# Patient Record
Sex: Female | Born: 1980 | Race: White | Hispanic: No | Marital: Married | Smoking: Never smoker
Health system: Southern US, Community
[De-identification: ages and names within clinical notes are randomized; demographics above are authoritative.]

## PROBLEM LIST (undated history)

## (undated) ENCOUNTER — Inpatient Hospital Stay (HOSPITAL_COMMUNITY): Payer: Self-pay

## (undated) DIAGNOSIS — Z8619 Personal history of other infectious and parasitic diseases: Secondary | ICD-10-CM

## (undated) DIAGNOSIS — Z789 Other specified health status: Secondary | ICD-10-CM

## (undated) HISTORY — PX: TONSILLECTOMY: SUR1361

## (undated) HISTORY — DX: Personal history of other infectious and parasitic diseases: Z86.19

## (undated) HISTORY — PX: NO PAST SURGERIES: SHX2092

## (undated) HISTORY — PX: COLONOSCOPY: SHX174

---

## 2011-05-26 ENCOUNTER — Encounter (HOSPITAL_COMMUNITY): Payer: Self-pay | Admitting: *Deleted

## 2011-05-26 ENCOUNTER — Inpatient Hospital Stay (HOSPITAL_COMMUNITY)
Admission: AD | Admit: 2011-05-26 | Discharge: 2011-05-30 | DRG: 373 | Disposition: A | Discharge status: Home / Self Care | Payer: BC Managed Care – PPO | Source: Ambulatory Visit | Attending: Obstetrics and Gynecology | Admitting: Obstetrics and Gynecology

## 2011-05-26 DIAGNOSIS — O480 Post-term pregnancy: Principal | ICD-10-CM | POA: Diagnosis present

## 2011-05-26 LAB — CBC
HCT: 36 % (ref 36.0–46.0)
RBC: 3.95 MIL/uL (ref 3.87–5.11)
RDW: 13.4 % (ref 11.5–15.5)
WBC: 9.3 10*3/uL (ref 4.0–10.5)

## 2011-05-28 LAB — ABO/RH: ABO/RH(D): O POS

## 2011-05-29 LAB — CBC
HCT: 25.9 % — ABNORMAL LOW (ref 36.0–46.0)
Platelets: 116 10*3/uL — ABNORMAL LOW (ref 150–400)
RBC: 2.84 MIL/uL — ABNORMAL LOW (ref 3.87–5.11)
RDW: 14.1 % (ref 11.5–15.5)
WBC: 18.3 10*3/uL — ABNORMAL HIGH (ref 4.0–10.5)

## 2011-06-12 NOTE — H&P (Signed)
  NAMENACOLE, FLUHR                 ACCOUNT NO.:  [id number]  MEDICAL RECORD NO.:  [id number]  LOCATION:  9174                          FACILITY:  [facility]  PHYSICIAN:  Anum Palecek H. Tenny Craw, MD     DATE OF BIRTH:  May 16, 1981  DATE OF ADMISSION:  05/27/2011 DATE OF DISCHARGE:                             HISTORY & PHYSICAL   CHIEF COMPLAINT:  Induction of labor.  HISTORY OF PRESENT ILLNESS:  Ms. Simko is a 30 year old G1, P0 at 40 weeks and 1 day estimated gestational age who presents for postdates induction of labor.  Her pregnancy has been uncomplicated up to this point.  She presents today for a scheduled postdates induction of labor.  PAST MEDICAL HISTORY:  Irritable bowel syndrome.  PAST SURGICAL HISTORY:  None.  PAST OBSTETRICAL HISTORY:  G1, P0.  PAST GYNECOLOGICAL HISTORY:  None.  SOCIAL HISTORY:  Negative x3.  FAMILY HISTORY:  Noncontributory.  PRENATAL LABORATORY DATA:  Blood type O+, antibody screen negative, RPR nonreactive, rubella titer immune, hepatitis B surface antigen negative, HIV nonreactive, and 1-hour sugar test 97.  First trimester screen, no increased risk.  GC and Chlamydia negative.  GBS negative.  Anatomy ultrasound was within normal limits.  Ultrasound for estimated fetal weight on Apr 13, 2011 was 2330 grams or 58th percentile.  PHYSICAL EXAMINATION:  VITAL SIGNS:  Afebrile, vital signs stable. GENERAL:  Alert and oriented x3.  No apparent distress. ABDOMEN:  Soft, nontender, and nondistended. EXTERNAL GENITALIA:  Cervix is 117, -2.  Fetal heart rate is in 130s and reactive.  Contractions are irregular.  ASSESSMENT AND PLAN:  This is a 30 year old G1, P0 at 40 weeks and 1 day for induction of labor.  The patient may have an epidural on request, Pitocin for augmentation, and amniotomy when able.     Freddrick March. Tenny Craw, MD     [facility]  D:  05/27/2011  T:  05/27/2011  Job:  [id number]  Electronically Signed by Waynard Reeds MD on 06/12/2011 11:01:52 AM

## 2014-07-23 LAB — OB RESULTS CONSOLE HEPATITIS B SURFACE ANTIGEN: HEP B S AG: NEGATIVE

## 2014-07-23 LAB — OB RESULTS CONSOLE ABO/RH: RH Type: POSITIVE

## 2014-07-23 LAB — OB RESULTS CONSOLE ANTIBODY SCREEN: Antibody Screen: NEGATIVE

## 2014-07-23 LAB — OB RESULTS CONSOLE GC/CHLAMYDIA
Chlamydia: NEGATIVE
GC PROBE AMP, GENITAL: NEGATIVE

## 2014-07-23 LAB — OB RESULTS CONSOLE RPR: RPR: NONREACTIVE

## 2014-07-23 LAB — OB RESULTS CONSOLE HIV ANTIBODY (ROUTINE TESTING): HIV: NONREACTIVE

## 2014-07-23 LAB — OB RESULTS CONSOLE RUBELLA ANTIBODY, IGM: RUBELLA: IMMUNE

## 2014-10-08 ENCOUNTER — Encounter (HOSPITAL_COMMUNITY): Payer: Self-pay | Admitting: *Deleted

## 2014-11-20 ENCOUNTER — Encounter (HOSPITAL_COMMUNITY): Payer: Self-pay | Admitting: *Deleted

## 2014-11-20 ENCOUNTER — Inpatient Hospital Stay (HOSPITAL_COMMUNITY)
Admission: AD | Admit: 2014-11-20 | Discharge: 2014-11-20 | Disposition: A | Discharge status: Home / Self Care | Payer: BC Managed Care – PPO | Source: Ambulatory Visit | Attending: Emergency Medicine | Admitting: Emergency Medicine

## 2014-11-20 DIAGNOSIS — Z3A28 28 weeks gestation of pregnancy: Secondary | ICD-10-CM | POA: Insufficient documentation

## 2014-11-20 DIAGNOSIS — R531 Weakness: Secondary | ICD-10-CM | POA: Diagnosis not present

## 2014-11-20 DIAGNOSIS — R42 Dizziness and giddiness: Secondary | ICD-10-CM | POA: Diagnosis present

## 2014-11-20 DIAGNOSIS — R5381 Other malaise: Secondary | ICD-10-CM | POA: Insufficient documentation

## 2014-11-20 DIAGNOSIS — K219 Gastro-esophageal reflux disease without esophagitis: Secondary | ICD-10-CM

## 2014-11-20 DIAGNOSIS — O9989 Other specified diseases and conditions complicating pregnancy, childbirth and the puerperium: Secondary | ICD-10-CM | POA: Diagnosis not present

## 2014-11-20 HISTORY — DX: Other specified health status: Z78.9

## 2014-11-20 LAB — CBC
HEMATOCRIT: 33.8 % — AB (ref 36.0–46.0)
HEMOGLOBIN: 11.2 g/dL — AB (ref 12.0–15.0)
MCH: 31.5 pg (ref 26.0–34.0)
MCHC: 33.1 g/dL (ref 30.0–36.0)
MCV: 94.9 fL (ref 78.0–100.0)
Platelets: 160 10*3/uL (ref 150–400)
RBC: 3.56 MIL/uL — ABNORMAL LOW (ref 3.87–5.11)
RDW: 13.2 % (ref 11.5–15.5)
WBC: 7.6 10*3/uL (ref 4.0–10.5)

## 2014-11-20 LAB — COMPREHENSIVE METABOLIC PANEL
ALBUMIN: 3 g/dL — AB (ref 3.5–5.2)
ALK PHOS: 92 U/L (ref 39–117)
ALT: 7 U/L (ref 0–35)
ANION GAP: 13 (ref 5–15)
AST: 20 U/L (ref 0–37)
BUN: 5 mg/dL — AB (ref 6–23)
CALCIUM: 8.3 mg/dL — AB (ref 8.4–10.5)
CO2: 22 mEq/L (ref 19–32)
CREATININE: 0.52 mg/dL (ref 0.50–1.10)
Chloride: 102 mEq/L (ref 96–112)
GFR calc non Af Amer: >90 mL/min (ref >90)
GLUCOSE: 101 mg/dL — AB (ref 70–99)
Potassium: 3.8 mEq/L (ref 3.7–5.3)
Sodium: 137 mEq/L (ref 137–147)
TOTAL PROTEIN: 6.3 g/dL (ref 6.0–8.3)
Total Bilirubin: 0.3 mg/dL (ref 0.3–1.2)

## 2014-11-20 LAB — URINALYSIS, ROUTINE W REFLEX MICROSCOPIC
BILIRUBIN URINE: NEGATIVE
GLUCOSE, UA: NEGATIVE mg/dL
Hgb urine dipstick: NEGATIVE
KETONES UR: NEGATIVE mg/dL
LEUKOCYTES UA: NEGATIVE
Nitrite: NEGATIVE
PH: 7.5 (ref 5.0–8.0)
PROTEIN: NEGATIVE mg/dL
Specific Gravity, Urine: <1.005 — ABNORMAL LOW (ref 1.005–1.030)
Urobilinogen, UA: 0.2 mg/dL (ref 0.0–1.0)

## 2014-11-20 LAB — TSH: TSH: 3.19 u[IU]/mL (ref 0.350–4.500)

## 2014-11-20 LAB — I-STAT TROPONIN, ED: Troponin i, poc: 0 ng/mL (ref 0.00–0.08)

## 2014-11-20 MED ORDER — SODIUM CHLORIDE 0.9 % IV BOLUS (SEPSIS)
1000.0000 mL | Freq: Once | INTRAVENOUS | Status: AC
  Administered 2014-11-20: 1000 mL via INTRAVENOUS

## 2014-11-20 MED ORDER — PANTOPRAZOLE SODIUM 20 MG PO TBEC: 20.0000 mg | DELAYED_RELEASE_TABLET | Freq: Every day | ORAL | Status: DC

## 2014-11-20 MED ORDER — GI COCKTAIL ~~LOC~~
30.0000 mL | Freq: Once | ORAL | Status: AC
  Administered 2014-11-20: 30 mL via ORAL
  Filled 2014-11-20: qty 30

## 2014-11-20 NOTE — MAU Note (Signed)
Feels very heavy like something is pulling her to the ground.  Feels very dizzy and nauseous.  On and off for the last month but has felt this way for three days straight.  Was instructed to come in if it happened again.  Denies LOF/VB.

## 2014-11-20 NOTE — ED Notes (Signed)
Pt is stable upon d/c and will return home with s/o. Pt. Will be escorted out by this RN.

## 2014-11-20 NOTE — Discharge Instructions (Signed)
Dizziness °Dizziness is a common problem. It is a feeling of unsteadiness or light-headedness. You may feel like you are about to faint. Dizziness can lead to injury if you stumble or fall. A person of any age group can suffer from dizziness, but dizziness is more common in older adults. °CAUSES  °Dizziness can be caused by many different things, including: °· Middle ear problems. °· Standing for too long. °· Infections. °· An allergic reaction. °· Aging. °· An emotional response to something, such as the sight of blood. °· Side effects of medicines. °· Tiredness. °· Problems with circulation or blood pressure. °· Excessive use of alcohol or medicines, or illegal drug use. °· Breathing too fast (hyperventilation). °· An irregular heart rhythm (arrhythmia). °· A low red blood cell count (anemia). °· Pregnancy. °· Vomiting, diarrhea, fever, or other illnesses that cause body fluid loss (dehydration). °· Diseases or conditions such as Parkinson's disease, high blood pressure (hypertension), diabetes, and thyroid problems. °· Exposure to extreme heat. °DIAGNOSIS  °Your health care provider will ask about your symptoms, perform a physical exam, and perform an electrocardiogram (ECG) to record the electrical activity of your heart. Your health care provider may also perform other heart or blood tests to determine the cause of your dizziness. These may include: °· Transthoracic echocardiogram (TTE). During echocardiography, sound waves are used to evaluate how blood flows through your heart. °· Transesophageal echocardiogram (TEE). °· Cardiac monitoring. This allows your health care provider to monitor your heart rate and rhythm in real time. °· Holter monitor. This is a portable device that records your heartbeat and can help diagnose heart arrhythmias. It allows your health care provider to track your heart activity for several days if needed. °· Stress tests by exercise or by giving medicine that makes the heart beat  faster. °TREATMENT  °Treatment of dizziness depends on the cause of your symptoms and can vary greatly. °HOME CARE INSTRUCTIONS  °· Drink enough fluids to keep your urine clear or pale yellow. This is especially important in very hot weather. In older adults, it is also important in cold weather. °· Take your medicine exactly as directed if your dizziness is caused by medicines. When taking blood pressure medicines, it is especially important to get up slowly. °· Rise slowly from chairs and steady yourself until you feel okay. °· In the morning, first sit up on the side of the bed. When you feel okay, stand slowly while holding onto something until you know your balance is fine. °· Move your legs often if you need to stand in one place for a long time. Tighten and relax your muscles in your legs while standing. °· Have someone stay with you for 1-2 days if dizziness continues to be a problem. Do this until you feel you are well enough to stay alone. Have the person call your health care provider if he or she notices changes in you that are concerning. °· Do not drive or use heavy machinery if you feel dizzy. °· Do not drink alcohol. °SEEK IMMEDIATE MEDICAL CARE IF:  °· Your dizziness or light-headedness gets worse. °· You feel nauseous or vomit. °· You have problems talking, walking, or using your arms, hands, or legs. °· You feel weak. °· You are not thinking clearly or you have trouble forming sentences. It may take a friend or family member to notice this. °· You have chest pain, abdominal pain, shortness of breath, or sweating. °· Your vision changes. °· You notice   any bleeding. °· You have side effects from medicine that seems to be getting worse rather than better. °MAKE SURE YOU:  °· Understand these instructions. °· Will watch your condition. °· Will get help right away if you are not doing well or get worse. °Document Released: 05/19/2001 Document Revised: 11/28/2013 Document Reviewed: 06/12/2011 °ExitCare®  Patient Information ©2015 [employer]. This information is not intended to replace advice given to you by your health care provider. Make sure you discuss any questions you have with your health care provider. ° °Second Trimester of Pregnancy °The second trimester is from week 13 through week 28, months 4 through 6. The second trimester is often a time when you feel your best. Your body has also adjusted to being pregnant, and you begin to feel better physically. Usually, morning sickness has lessened or quit completely, you may have more energy, and you may have an increase in appetite. The second trimester is also a time when the fetus is growing rapidly. At the end of the sixth month, the fetus is about 9 inches long and weighs about 1½ pounds. You will likely begin to feel the baby move (quickening) between 18 and 20 weeks of the pregnancy. °BODY CHANGES °Your body goes through many changes during pregnancy. The changes vary from woman to woman.  °· Your weight will continue to increase. You will notice your lower abdomen bulging out. °· You may begin to get stretch marks on your hips, abdomen, and breasts. °· You may develop headaches that can be relieved by medicines approved by your health care provider. °· You may urinate more often because the fetus is pressing on your bladder. °· You may develop or continue to have heartburn as a result of your pregnancy. °· You may develop constipation because certain hormones are causing the muscles that push waste through your intestines to slow down. °· You may develop hemorrhoids or swollen, bulging veins (varicose veins). °· You may have back pain because of the weight gain and pregnancy hormones relaxing your joints between the bones in your pelvis and as a result of a shift in weight and the muscles that support your balance. °· Your breasts will continue to grow and be tender. °· Your gums may bleed and may be sensitive to brushing and flossing. °· Dark spots or  blotches (chloasma, mask of pregnancy) may develop on your face. This will likely fade after the baby is born. °· A dark line from your belly button to the pubic area (linea nigra) may appear. This will likely fade after the baby is born. °· You may have changes in your hair. These can include thickening of your hair, rapid growth, and changes in texture. Some women also have hair loss during or after pregnancy, or hair that feels dry or thin. Your hair will most likely return to normal after your baby is born. °WHAT TO EXPECT AT YOUR PRENATAL VISITS °During a routine prenatal visit: °· You will be weighed to make sure you and the fetus are growing normally. °· Your blood pressure will be taken. °· Your abdomen will be measured to track your baby's growth. °· The fetal heartbeat will be listened to. °· Any test results from the previous visit will be discussed. °Your health care provider may ask you: °· How you are feeling. °· If you are feeling the baby move. °· If you have had any abnormal symptoms, such as leaking fluid, bleeding, severe headaches, or abdominal cramping. °· If you   have any questions. °Other tests that may be performed during your second trimester include: °· Blood tests that check for: °¨ Low iron levels (anemia). °¨ Gestational diabetes (between 24 and 28 weeks). °¨ Rh antibodies. °· Urine tests to check for infections, diabetes, or protein in the urine. °· An ultrasound to confirm the proper growth and development of the baby. °· An amniocentesis to check for possible genetic problems. °· Fetal screens for spina bifida and Down syndrome. °HOME CARE INSTRUCTIONS  °· Avoid all smoking, herbs, alcohol, and unprescribed drugs. These chemicals affect the formation and growth of the baby. °· Follow your health care provider's instructions regarding medicine use. There are medicines that are either safe or unsafe to take during pregnancy. °· Exercise only as directed by your health care provider.  Experiencing uterine cramps is a [place] sign to stop exercising. °· Continue to eat regular, healthy meals. °· Wear a Schloemer support bra for breast tenderness. °· Do not use hot tubs, steam rooms, or saunas. °· Wear your seat belt at all times when driving. °· Avoid raw meat, uncooked cheese, cat litter boxes, and soil used by cats. These carry germs that can cause birth defects in the baby. °· Take your prenatal vitamins. °· Try taking a stool softener (if your health care provider approves) if you develop constipation. Eat more high-fiber foods, such as fresh vegetables or fruit and whole grains. Drink plenty of fluids to keep your urine clear or pale yellow. °· Take warm sitz baths to soothe any pain or discomfort caused by hemorrhoids. Use hemorrhoid cream if your health care provider approves. °· If you develop varicose veins, wear support hose. Elevate your feet for 15 minutes, 3-4 times a day. Limit salt in your diet. °· Avoid heavy lifting, wear low heel shoes, and practice [place] posture. °· Rest with your legs elevated if you have leg cramps or low back pain. °· Visit your dentist if you have not gone yet during your pregnancy. Use a soft toothbrush to brush your teeth and be gentle when you floss. °· A sexual relationship may be continued unless your health care provider directs you otherwise. °· Continue to go to all your prenatal visits as directed by your health care provider. °SEEK MEDICAL CARE IF:  °· You have dizziness. °· You have mild pelvic cramps, pelvic pressure, or nagging pain in the abdominal area. °· You have persistent nausea, vomiting, or diarrhea. °· You have a bad smelling vaginal discharge. °· You have pain with urination. °SEEK IMMEDIATE MEDICAL CARE IF:  °· You have a fever. °· You are leaking fluid from your vagina. °· You have spotting or bleeding from your vagina. °· You have severe abdominal cramping or pain. °· You have rapid weight gain or loss. °· You have shortness of breath with  chest pain. °· You notice sudden or extreme swelling of your face, hands, ankles, feet, or legs. °· You have not felt your baby move in over an hour. °· You have severe headaches that do not go away with medicine. °· You have vision changes. °Document Released: 11/17/2001 Document Revised: 11/28/2013 Document Reviewed: 01/24/2013 °ExitCare® Patient Information ©2015 [employer]. This information is not intended to replace advice given to you by your health care provider. Make sure you discuss any questions you have with your health care provider. ° °

## 2014-11-20 NOTE — ED Notes (Signed)
[facility] Pt is 28wks and 6days. Pt has c/o dizziness accompanied with sweating, nausea and a heavy feeling across torso.

## 2014-11-20 NOTE — ED Provider Notes (Signed)
TIME SEEN: 4:48 PM  CHIEF COMPLAINT: Nausea, dizziness, sweating  HPI: Pt is a 33 y.o. G2P1 who is 28 weeks and 6 days pregnant who presents the emergency department with 2 months of intermittent episodes of feeling lightheaded, vertiginous and "heavy" that has been more frequent over the past 3 days. She is followed by Dr. Tenny Crawoss with OBGYN.  She states she has talked to Dr. Tenny Crawoss about her symptoms and they thought that this was related to her blood pressure as it has been low. Patient reports that she frequently has low blood pressure even without being pregnant. They told her that if her symptoms got worse over she need to be evaluated. [facility] because her symptoms have been more frequent the past 3 days. She states she has had 2-3 episodes of feeling lightheaded each day over the past 3 days. There she was noted to have a blood pressure of 88/44 with lying flat and a blood pressure of 100/69 with standing. She was found to have reassuring fetal heart tones and no contractions. Denies any vaginal bleeding or discharge. She has [place] fetal activity. [facility] she had an episode of epigastric pain that did not improve with GI cocktail. Denies that she has had any chest pain or shortness of breath. Denies having this epigastric pain currently. Describes her symptoms as feeling lightheaded but also at times vertiginous. Denies any aggravating or relieving factors. She is not sure if there is any change with change of position. Denies any recent vomiting or diarrhea. She states she will get nauseous with these episodes and begin sweating. No headache, numbness, tingling or focal weakness. She has been eating and drinking well. No bloody stool or melena. [facility] had blood work drawn which showed normal labs with a hemoglobin 11.2, normal urine, normal LFTs. She was sent to the emergency department for further evaluation. She states that she is feeling well  currently without any complaints. No lightheadedness or vertigo currently.  ROS: See HPI Constitutional: no fever  Eyes: no drainage  ENT: no runny nose   Cardiovascular:  no chest pain  Resp: no SOB  GI: no vomiting GU: no dysuria Integumentary: no rash  Allergy: no hives  Musculoskeletal: no leg swelling  Neurological: no slurred speech ROS otherwise negative  PAST MEDICAL HISTORY/PAST SURGICAL HISTORY:  Past Medical History  Diagnosis Date  . Medical history non-contributory     MEDICATIONS:  Prior to Admission medications   Medication Sig Start Date End Date Taking? Authorizing Provider  Prenatal Vit-Fe Fumarate-FA (PRENATAL MULTIVITAMIN) TABS tablet Take 1 tablet by mouth daily at 12 noon.   Yes Historical Provider, MD  pantoprazole (PROTONIX) 20 MG tablet Take 1 tablet (20 mg total) by mouth daily. 11/20/14   Aviva SignsMarie L Williams, CNM    ALLERGIES:  No Known Allergies  SOCIAL HISTORY:  History  Substance Use Topics  . Smoking status: Never Smoker   . Smokeless tobacco: Not on file  . Alcohol Use: No    FAMILY HISTORY: History reviewed. No pertinent family history.  EXAM: BP 95/52 mmHg  Pulse 81  Temp(Src) 98.4 F (36.9 C) (Oral)  Resp 18  Ht 5\' 4"  (1.626 m)  Wt 144 lb 6 oz (65.488 kg)  BMI 24.77 kg/m2  SpO2 100% CONSTITUTIONAL: Alert and oriented and responds appropriately to questions. Well-appearing; well-nourished HEAD: Normocephalic EYES: Conjunctivae clear, PERRL ENT: normal nose; no rhinorrhea; moist mucous membranes; pharynx without lesions noted; no  tonsillar hypertrophy or exudate, no uvular deviation NECK: Supple, no meningismus, no LAD  CARD: RRR; S1 and S2 appreciated; no murmurs, no clicks, no rubs, no gallops RESP: Normal chest excursion without splinting or tachypnea; breath sounds clear and equal bilaterally; no wheezes, no rhonchi, no rales, no hypoxia or respiratory distress ABD/GI: Normal bowel sounds; non-distended; soft, non-tender,  no rebound, no guarding BACK:  The back appears normal and is non-tender to palpation, there is no CVA tenderness EXT: Normal ROM in all joints; non-tender to palpation; no edema; normal capillary refill; no cyanosis, no Tenderness or swelling    SKIN: Normal color for age and race; warm NEURO: Moves all extremities equally, sensation to light touch intact diffusely, cranial nerves II through XII intact, no dysmetria to finger to nose testing, strength 5/5 in all 4 extremities PSYCH: The patient's mood and manner are appropriate. Grooming and personal hygiene are appropriate.  MEDICAL DECISION MAKING: Patient here with lightheadedness, vertigo and feeling "heavy". She had normal labs and urinalysis. She has had some mild hypotension which she reports is chronic. Will obtain EKG, troponin, TSH. We'll give IV fluids, by mouth challenge. We'll continue to closely monitor. Have low suspicion for any life-threatening illness. Recommended close outpatient follow-up with her OB/GYN. She is currently asymptomatic.  ED PROGRESS: 6:20 PM  Pt is still asymptomatic. She has received 2 L of IV fluids with some improvement in blood pressure. She states her blood pressure is currently at her baseline.. Have advised her to continue to drink plenty of oral fluids and follow-up with her OB/GYN. Have advised her that when she is changing position from a seating or lying flat position to do so very slowly. Suspect that her symptoms are likely secondary to relative hypotension in the setting of being pregnant. I do not think there is any life-threatening illness present currently. Troponin negative. EKG shows no ischemic changes, arrhythmia, delta wave, Brugada, interval changes. TSH normal. Discussed return precautions. She verbalized understanding and is comfortable with plan. Husband at bedside.  Of note, vital signs do document 2 episodes where she had a low heart rate and low oxygen level but these are erroneous  measurements.      Date: 11/20/2014  16:57  Rate: 73  Rhythm: normal sinus rhythm  QRS Axis: normal  Intervals: normal  ST/T Wave abnormalities: normal  Conduction Disutrbances: none  Narrative Interpretation: unremarkable; low voltage precordial leads; no old for comparison      Layla MawKristen N Gayla Benn, DO 11/20/14 1825

## 2014-11-20 NOTE — MAU Provider Note (Signed)
History     CSN: [id number]  Arrival date and time: 11/20/14 1021   None     Chief Complaint  Patient presents with  . Nausea  . Dizziness   HPI This is a 33 y.o. female at [redacted]w[redacted]d who presents with c/o multiple symptoms. For a month, she has felt progressively "heavy", like "I can't get low enough to the ground, like something is pulling me to the ground".  Also has dizziness and nausea. Has gotten much worse the last 3 days. Denies dyspnea or specific chest pain, but does report some substernal "pressure".  Denies overt acid reflux. Denies hx of asthma or heart problems. Denies weakness in extremities. Does improve with lying down.    RN Note:  Expand All Collapse All   Feels very heavy like something is pulling her to the ground. Feels very dizzy and nauseous. On and off for the last month but has felt this way for three days straight. Was instructed to come in if it happened again. Denies LOF/VB.          OB History    Gravida Para Term Preterm AB TAB SAB Ectopic Multiple Living   2 1 1       1       Past Medical History  Diagnosis Date  . Medical history non-contributory     Past Surgical History  Procedure Laterality Date  . No past surgeries      History reviewed. No pertinent family history.  History  Substance Use Topics  . Smoking status: Never Smoker   . Smokeless tobacco: Not on file  . Alcohol Use: No    Allergies: No Known Allergies  Prescriptions prior to admission  Medication Sig Dispense Refill Last Dose  . Prenatal Vit-Fe Fumarate-FA (PRENATAL MULTIVITAMIN) TABS tablet Take 1 tablet by mouth daily at 12 noon.   11/20/2014 at Unknown time    Review of Systems  Constitutional: Positive for malaise/fatigue. Negative for fever and chills.  HENT: Negative for congestion and sore throat.   Eyes: Negative for blurred vision.  Respiratory: Negative for cough and shortness of breath.   Cardiovascular: Negative for chest pain and  palpitations.  Gastrointestinal: Positive for nausea. Negative for heartburn, vomiting, abdominal pain, diarrhea and constipation.  Musculoskeletal: Negative for myalgias.  Neurological: Positive for dizziness and weakness. Negative for tingling, sensory change, focal weakness, loss of consciousness and headaches.   Physical Exam   Blood pressure 111/57, pulse 83, temperature 99 F (37.2 C), temperature source Oral, resp. rate 18, height 5\' 4"  (1.626 m), weight 144 lb 6 oz (65.488 kg).  Physical Exam  Constitutional: She is oriented to person, place, and time. She appears well-developed and well-nourished. No distress.  HENT:  Head: Normocephalic.  Cardiovascular: Regular rhythm and normal heart sounds.  Exam reveals no gallop and no friction rub.   No murmur (slightly tachycardic, 90-100s.) heard. Respiratory: Effort normal and breath sounds normal. No respiratory distress. She has no wheezes. She has no rales. She exhibits no tenderness.  GI: Soft. She exhibits no distension. There is no tenderness. There is no rebound and no guarding.  Genitourinary:  FHR reassuring and reactive No contractions  Musculoskeletal: Normal range of motion.  Neurological: She is alert and oriented to person, place, and time.  Skin: Skin is warm and dry.  Psychiatric: She has a normal mood and affect.   Orthostatic VS Filed Vitals:   11/20/14 1225 11/20/14 1226 11/20/14 1227 11/20/14 1230  BP: 88/44 91/52 99/58  100/69  Pulse: 67 80 96 89  Temp:      TempSrc:      Resp:      Height:      Weight:                           Lying                            Sitting                                                            Standing    Results for orders placed or performed during the hospital encounter of 11/20/14 (from the past 24 hour(s))  Urinalysis, Routine w reflex microscopic     Status: Abnormal   Collection Time: 11/20/14 10:40 AM  Result Value Ref Range   Color, Urine YELLOW YELLOW    APPearance CLEAR CLEAR   Specific Gravity, Urine <1.005 (L) 1.005 - 1.030   pH 7.5 5.0 - 8.0   Glucose, UA NEGATIVE NEGATIVE mg/dL   Hgb urine dipstick NEGATIVE NEGATIVE   Bilirubin Urine NEGATIVE NEGATIVE   Ketones, ur NEGATIVE NEGATIVE mg/dL   Protein, ur NEGATIVE NEGATIVE mg/dL   Urobilinogen, UA 0.2 0.0 - 1.0 mg/dL   Nitrite NEGATIVE NEGATIVE   Leukocytes, UA NEGATIVE NEGATIVE  CBC     Status: Abnormal   Collection Time: 11/20/14 12:38 PM  Result Value Ref Range   WBC 7.6 4.0 - 10.5 K/uL   RBC 3.56 (L) 3.87 - 5.11 MIL/uL   Hemoglobin 11.2 (L) 12.0 - 15.0 g/dL   HCT 16.133.8 (L) 09.636.0 - 04.546.0 %   MCV 94.9 78.0 - 100.0 fL   MCH 31.5 26.0 - 34.0 pg   MCHC 33.1 30.0 - 36.0 g/dL   RDW 40.913.2 81.111.5 - 91.415.5 %   Platelets 160 150 - 400 K/uL  Comprehensive metabolic panel     Status: Abnormal   Collection Time: 11/20/14 12:38 PM  Result Value Ref Range   Sodium 137 137 - 147 mEq/L   Potassium 3.8 3.7 - 5.3 mEq/L   Chloride 102 96 - 112 mEq/L   CO2 22 19 - 32 mEq/L   Glucose, Bld 101 (H) 70 - 99 mg/dL   BUN 5 (L) 6 - 23 mg/dL   Creatinine, Ser 7.820.52 0.50 - 1.10 mg/dL   Calcium 8.3 (L) 8.4 - 10.5 mg/dL   Total Protein 6.3 6.0 - 8.3 g/dL   Albumin 3.0 (L) 3.5 - 5.2 g/dL   AST 20 0 - 37 U/L   ALT 7 0 - 35 U/L   Alkaline Phosphatase 92 39 - 117 U/L   Total Bilirubin 0.3 0.3 - 1.2 mg/dL   GFR calc non Af Amer >90 >90 mL/min   GFR calc Af Amer >90 >90 mL/min   Anion gap 13 5 - 15    MAU Course  Procedures  MDM Discussed with Dr Dareen PianoAnderson. He suggested we might treat for reflux. Before discharging her, I gave her a GI cocktail to see if that would improve her epigastric pressure, and it did not.   Later, rechecked on patient and she states she feels worse. Now, cannot tolerate any elevation of  Head of bed. States she feels much "heavier", "like I cannot get low enough to the floor".   States cannot even lift head off pillow.   Heart rate slightly rapid (low 100s), regular. Lungs clear  bilaterally. WIll check Pulse Ox.  >> 99-100%.   Abdomen soft and nontender. Bilateral hand grips equal and strong. Discussed with Dr Dareen PianoAnderson.  He [facility].   Assessment and Plan  A:  SIUP at [id number]       Normal fetal status, no labor      Malaise, weakness, dizziness, "feeling heavy"       P:  [facility]       Dr Littie DeedsGentry attending there.   [facility] CenterWILLIAMS,Anna Trotta 11/20/2014, 11:44 AM

## 2014-11-20 NOTE — MAU Note (Signed)
Pt states for past month has had intermittent nausea and body feels "heavy". Was told by provider to come to MAU for eval if next episode felt worse. No vomiting.

## 2014-12-07 NOTE — L&D Delivery Note (Signed)
Delivery Note At 7:52 PM a viable and healthy female was delivered via Vaginal, Spontaneous Delivery (Presentation: Right Occiput Anterior).  APGAR: 9, 9; weight  pending.   Placenta status: Intact, Spontaneous.  Cord: 3 vessels   Anesthesia: Epidural  Episiotomy: None Lacerations: 2nd degree Suture Repair: 3.0 vicryl Est. Blood Loss (mL): 300  Mom to postpartum.  Baby to Couplet care / Skin to Skin.  Jayme Cham H. 02/08/2015, 8:20 PM

## 2015-01-09 LAB — OB RESULTS CONSOLE GBS: GBS: NEGATIVE

## 2015-02-06 ENCOUNTER — Encounter (HOSPITAL_COMMUNITY): Payer: Self-pay | Admitting: *Deleted

## 2015-02-06 ENCOUNTER — Telehealth (HOSPITAL_COMMUNITY): Payer: Self-pay | Admitting: *Deleted

## 2015-02-06 NOTE — Telephone Encounter (Signed)
Preadmission screen  

## 2015-02-08 ENCOUNTER — Encounter (HOSPITAL_COMMUNITY): Payer: Self-pay

## 2015-02-08 ENCOUNTER — Inpatient Hospital Stay (HOSPITAL_COMMUNITY): Payer: BC Managed Care – PPO | Admitting: Anesthesiology

## 2015-02-08 ENCOUNTER — Inpatient Hospital Stay (HOSPITAL_COMMUNITY)
Admission: RE | Admit: 2015-02-08 | Discharge: 2015-02-10 | DRG: 775 | Disposition: A | Discharge status: Home / Self Care | Payer: BC Managed Care – PPO | Source: Ambulatory Visit | Attending: Obstetrics and Gynecology | Admitting: Obstetrics and Gynecology

## 2015-02-08 DIAGNOSIS — O480 Post-term pregnancy: Secondary | ICD-10-CM | POA: Diagnosis present

## 2015-02-08 DIAGNOSIS — Z23 Encounter for immunization: Secondary | ICD-10-CM | POA: Diagnosis not present

## 2015-02-08 DIAGNOSIS — Z3A40 40 weeks gestation of pregnancy: Secondary | ICD-10-CM | POA: Diagnosis present

## 2015-02-08 LAB — CBC
HEMATOCRIT: 37.4 % (ref 36.0–46.0)
HEMOGLOBIN: 12.2 g/dL (ref 12.0–15.0)
MCH: 30.6 pg (ref 26.0–34.0)
MCHC: 32.6 g/dL (ref 30.0–36.0)
MCV: 93.7 fL (ref 78.0–100.0)
Platelets: 158 10*3/uL (ref 150–400)
RBC: 3.99 MIL/uL (ref 3.87–5.11)
RDW: 14.8 % (ref 11.5–15.5)
WBC: 7.5 10*3/uL (ref 4.0–10.5)

## 2015-02-08 LAB — TYPE AND SCREEN
ABO/RH(D): O POS
Antibody Screen: NEGATIVE

## 2015-02-08 LAB — RPR: RPR Ser Ql: NONREACTIVE

## 2015-02-08 MED ORDER — EPHEDRINE 5 MG/ML INJ: 10.0000 mg | INTRAVENOUS | Status: DC | PRN

## 2015-02-08 MED ORDER — OXYTOCIN BOLUS FROM INFUSION: 500.0000 mL | INTRAVENOUS | Status: DC

## 2015-02-08 MED ORDER — DIPHENHYDRAMINE HCL 50 MG/ML IJ SOLN: 12.5000 mg | INTRAMUSCULAR | Status: DC | PRN

## 2015-02-08 MED ORDER — SIMETHICONE 80 MG PO CHEW: 80.0000 mg | CHEWABLE_TABLET | ORAL | Status: DC | PRN

## 2015-02-08 MED ORDER — FENTANYL 2.5 MCG/ML BUPIVACAINE 1/10 % EPIDURAL INFUSION (WH - ANES)
14.0000 mL/h | INTRAMUSCULAR | Status: DC | PRN
Start: 2015-02-08 — End: 2015-02-08
  Administered 2015-02-08 (×2): 14 mL/h via EPIDURAL
  Filled 2015-02-08 (×2): qty 125

## 2015-02-08 MED ORDER — OXYCODONE-ACETAMINOPHEN 5-325 MG PO TABS
1.0000 | ORAL_TABLET | ORAL | Status: DC | PRN
  Administered 2015-02-09 – 2015-02-10 (×4): 1 via ORAL
  Filled 2015-02-08 (×3): qty 1

## 2015-02-08 MED ORDER — LACTATED RINGERS IV SOLN: 500.0000 mL | Freq: Once | INTRAVENOUS | Status: DC

## 2015-02-08 MED ORDER — TERBUTALINE SULFATE 1 MG/ML IJ SOLN: 0.2500 mg | Freq: Once | INTRAMUSCULAR | Status: DC | PRN

## 2015-02-08 MED ORDER — LACTATED RINGERS IV SOLN: 500.0000 mL | INTRAVENOUS | Status: DC | PRN

## 2015-02-08 MED ORDER — OXYCODONE-ACETAMINOPHEN 5-325 MG PO TABS
1.0000 | ORAL_TABLET | ORAL | Status: DC | PRN
Start: 2015-02-08 — End: 2015-02-08

## 2015-02-08 MED ORDER — ACETAMINOPHEN 325 MG PO TABS: 650.0000 mg | ORAL_TABLET | ORAL | Status: DC | PRN

## 2015-02-08 MED ORDER — OXYTOCIN 40 UNITS IN LACTATED RINGERS INFUSION - SIMPLE MED: 62.5000 mL/h | INTRAVENOUS | Status: DC

## 2015-02-08 MED ORDER — TETANUS-DIPHTH-ACELL PERTUSSIS 5-2.5-18.5 LF-MCG/0.5 IM SUSP: 0.5000 mL | Freq: Once | INTRAMUSCULAR | Status: AC

## 2015-02-08 MED ORDER — LIDOCAINE HCL (PF) 1 % IJ SOLN
INTRAMUSCULAR | Status: DC | PRN
  Administered 2015-02-08 (×2): 4 mL

## 2015-02-08 MED ORDER — OXYCODONE-ACETAMINOPHEN 5-325 MG PO TABS
2.0000 | ORAL_TABLET | ORAL | Status: DC | PRN
  Administered 2015-02-09: 2 via ORAL
  Filled 2015-02-08 (×2): qty 2

## 2015-02-08 MED ORDER — PHENYLEPHRINE 40 MCG/ML (10ML) SYRINGE FOR IV PUSH (FOR BLOOD PRESSURE SUPPORT)
80.0000 ug | PREFILLED_SYRINGE | INTRAVENOUS | Status: DC | PRN
  Filled 2015-02-08: qty 20

## 2015-02-08 MED ORDER — DIBUCAINE 1 % RE OINT
1.0000 "application " | TOPICAL_OINTMENT | RECTAL | Status: DC | PRN
  Administered 2015-02-09: 1 via RECTAL
  Filled 2015-02-08: qty 28

## 2015-02-08 MED ORDER — BENZOCAINE-MENTHOL 20-0.5 % EX AERO
1.0000 "application " | INHALATION_SPRAY | CUTANEOUS | Status: DC | PRN
  Administered 2015-02-09: 1 via TOPICAL
  Filled 2015-02-08: qty 56

## 2015-02-08 MED ORDER — ONDANSETRON HCL 4 MG PO TABS: 4.0000 mg | ORAL_TABLET | ORAL | Status: DC | PRN

## 2015-02-08 MED ORDER — LACTATED RINGERS IV SOLN
INTRAVENOUS | Status: DC
  Administered 2015-02-08 (×2): 1000 mL via INTRAVENOUS

## 2015-02-08 MED ORDER — PRENATAL MULTIVITAMIN CH
1.0000 | ORAL_TABLET | Freq: Every day | ORAL | Status: DC
Start: 2015-02-09 — End: 2015-02-10
  Administered 2015-02-09 – 2015-02-10 (×2): 1 via ORAL
  Filled 2015-02-08 (×2): qty 1

## 2015-02-08 MED ORDER — ONDANSETRON HCL 4 MG/2ML IJ SOLN: 4.0000 mg | Freq: Four times a day (QID) | INTRAMUSCULAR | Status: DC | PRN

## 2015-02-08 MED ORDER — ZOLPIDEM TARTRATE 5 MG PO TABS: 5.0000 mg | ORAL_TABLET | Freq: Every evening | ORAL | Status: DC | PRN

## 2015-02-08 MED ORDER — WITCH HAZEL-GLYCERIN EX PADS
1.0000 "application " | MEDICATED_PAD | CUTANEOUS | Status: DC | PRN
  Administered 2015-02-09: 1 via TOPICAL

## 2015-02-08 MED ORDER — OXYCODONE-ACETAMINOPHEN 5-325 MG PO TABS
2.0000 | ORAL_TABLET | ORAL | Status: DC | PRN
Start: 2015-02-08 — End: 2015-02-08

## 2015-02-08 MED ORDER — IBUPROFEN 600 MG PO TABS
600.0000 mg | ORAL_TABLET | Freq: Four times a day (QID) | ORAL | Status: DC
  Administered 2015-02-08 – 2015-02-10 (×7): 600 mg via ORAL
  Filled 2015-02-08 (×7): qty 1

## 2015-02-08 MED ORDER — FENTANYL 2.5 MCG/ML BUPIVACAINE 1/10 % EPIDURAL INFUSION (WH - ANES)
INTRAMUSCULAR | Status: DC | PRN
  Administered 2015-02-08: 14 mL/h via EPIDURAL

## 2015-02-08 MED ORDER — FLEET ENEMA 7-19 GM/118ML RE ENEM: 1.0000 | ENEMA | Freq: Every day | RECTAL | Status: DC | PRN

## 2015-02-08 MED ORDER — PHENYLEPHRINE 40 MCG/ML (10ML) SYRINGE FOR IV PUSH (FOR BLOOD PRESSURE SUPPORT): 80.0000 ug | PREFILLED_SYRINGE | INTRAVENOUS | Status: DC | PRN

## 2015-02-08 MED ORDER — LANOLIN HYDROUS EX OINT: TOPICAL_OINTMENT | CUTANEOUS | Status: DC | PRN

## 2015-02-08 MED ORDER — DIPHENHYDRAMINE HCL 25 MG PO CAPS: 25.0000 mg | ORAL_CAPSULE | Freq: Four times a day (QID) | ORAL | Status: DC | PRN

## 2015-02-08 MED ORDER — CITRIC ACID-SODIUM CITRATE 334-500 MG/5ML PO SOLN: 30.0000 mL | ORAL | Status: DC | PRN

## 2015-02-08 MED ORDER — OXYTOCIN 40 UNITS IN LACTATED RINGERS INFUSION - SIMPLE MED
1.0000 m[IU]/min | INTRAVENOUS | Status: DC
  Administered 2015-02-08: 2 m[IU]/min via INTRAVENOUS
  Administered 2015-02-08: 10 m[IU]/min via INTRAVENOUS
  Filled 2015-02-08: qty 1000

## 2015-02-08 MED ORDER — SENNOSIDES-DOCUSATE SODIUM 8.6-50 MG PO TABS
2.0000 | ORAL_TABLET | ORAL | Status: DC
Start: 2015-02-09 — End: 2015-02-10
  Administered 2015-02-09 (×2): 2 via ORAL
  Filled 2015-02-08 (×2): qty 2

## 2015-02-08 MED ORDER — LIDOCAINE HCL (PF) 1 % IJ SOLN
30.0000 mL | INTRAMUSCULAR | Status: DC | PRN
  Filled 2015-02-08: qty 30

## 2015-02-08 MED ORDER — ONDANSETRON HCL 4 MG/2ML IJ SOLN: 4.0000 mg | INTRAMUSCULAR | Status: DC | PRN

## 2015-02-08 NOTE — Anesthesia Procedure Notes (Signed)
Epidural Patient location during procedure: OB Start time: 02/08/2015 11:58 AM  Staffing Anesthesiologist: Laraine Samet A. Performed by: anesthesiologist   Preanesthetic Checklist Completed: patient identified, site marked, surgical consent, pre-op evaluation, timeout performed, IV checked, risks and benefits discussed and monitors and equipment checked  Epidural Patient position: sitting Prep: site prepped and draped and DuraPrep Patient monitoring: continuous pulse ox and blood pressure Approach: midline Location: L3-L4 Injection technique: LOR air  Needle:  Needle type: Tuohy  Needle gauge: 17 G Needle length: 9 cm and 9 Needle insertion depth: 5 cm cm Catheter type: closed end flexible Catheter size: 19 Gauge Catheter at skin depth: 10 cm Test dose: negative and Other  Assessment Events: blood not aspirated, injection not painful, no injection resistance, negative IV test and no paresthesia  Additional Notes Patient identified. Risks and benefits discussed including failed block, incomplete  Pain control, post dural puncture headache, nerve damage, paralysis, blood pressure Changes, nausea, vomiting, reactions to medications-both toxic and allergic and post Partum back pain. All questions were answered. Patient expressed understanding and wished to proceed. Sterile technique was used throughout procedure. Epidural site was Dressed with sterile barrier dressing. No paresthesias, signs of intravascular injection Or signs of intrathecal spread were encountered.  Patient was more comfortable after the epidural was dosed. Please see RN's note for documentation of vital signs and FHR which are stable.

## 2015-02-08 NOTE — H&P (Signed)
Anna Huff is a 34 y.o. female presenting for PD IOL  34 yo G3P1011 @ 40+2 presents for IOL for PD. Her pregnancy has been uncomplicated History OB History    Gravida Para Term Preterm AB TAB SAB Ectopic Multiple Living   3 1 1  1  1   1      Past Medical History  Diagnosis Date  . Medical history non-contributory   . Hx of varicella    Past Surgical History  Procedure Laterality Date  . No past surgeries    . Tonsillectomy    . Colonoscopy     Family History: family history includes Cancer in her maternal grandmother; Diabetes in her paternal grandmother. Social History:  reports that she has never smoked. She has never used smokeless tobacco. She reports that she does not drink alcohol or use illicit drugs.   Prenatal Transfer Tool  Maternal Diabetes: No Genetic Screening: Normal Maternal Ultrasounds/Referrals: Normal Fetal Ultrasounds or other Referrals:  None Maternal Substance Abuse:  No Significant Maternal Medications:  None Significant Maternal Lab Results:  None Other Comments:  None  ROS  Dilation: Lip/rim Effacement (%): 100 Station: -2 Exam by:: Dr Tenny Crawoss Blood pressure 109/69, pulse 88, temperature 97.9 F (36.6 C), temperature source Oral, resp. rate 18, height 5\' 4"  (1.626 m), weight 74.39 kg (164 lb), SpO2 100 %. Exam Physical Exam  Prenatal labs: ABO, Rh: --/--/O POS (03/04 16100723) Antibody: NEG (03/04 0723) Rubella: Immune (08/17 0000) RPR: Non Reactive (03/04 0723)  HBsAg: Negative (08/17 0000)  HIV: Non-reactive (08/17 0000)  GBS: Negative (02/03 0000)   Assessment/Plan: 1) admit 2) AROM/Pit 3) Epidural on request   Anna Huff H. 02/08/2015, 6:25 PM

## 2015-02-08 NOTE — Anesthesia Preprocedure Evaluation (Addendum)
Anesthesia Evaluation  Patient identified by MRN, date of birth, ID band Patient awake    Reviewed: Allergy & Precautions, Patient's Chart, lab work & pertinent test results  Airway Mallampati: II  TM Distance: >3 FB Neck ROM: Full    Dental no notable dental hx. (+) Teeth Intact   Pulmonary neg pulmonary ROS,  breath sounds clear to auscultation  Pulmonary exam normal       Cardiovascular negative cardio ROS  Rhythm:Regular Rate:Normal     Neuro/Psych negative neurological ROS  negative psych ROS   GI/Hepatic Neg liver ROS, GERD-  ,  Endo/Other  negative endocrine ROS  Renal/GU negative Renal ROS  negative genitourinary   Musculoskeletal negative musculoskeletal ROS (+)   Abdominal (+) - obese,   Peds  Hematology negative hematology ROS (+)   Anesthesia Other Findings   Reproductive/Obstetrics (+) Pregnancy                            Anesthesia Physical Anesthesia Plan  ASA: II  Anesthesia Plan: Epidural   Post-op Pain Management:    Induction:   Airway Management Planned: Natural Airway  Additional Equipment:   Intra-op Plan:   Post-operative Plan:   Informed Consent: I have reviewed the patients History and Physical, chart, labs and discussed the procedure including the risks, benefits and alternatives for the proposed anesthesia with the patient or authorized representative who has indicated his/her understanding and acceptance.     Plan Discussed with: Anesthesiologist  Anesthesia Plan Comments:         Anesthesia Quick Evaluation

## 2015-02-09 LAB — CBC
HCT: 29.3 % — ABNORMAL LOW (ref 36.0–46.0)
Hemoglobin: 9.7 g/dL — ABNORMAL LOW (ref 12.0–15.0)
MCH: 31.1 pg (ref 26.0–34.0)
MCHC: 33.4 g/dL (ref 30.0–36.0)
MCV: 93 fL (ref 78.0–100.0)
Platelets: 125 10*3/uL — ABNORMAL LOW (ref 150–400)
RBC: 3.15 MIL/uL — AB (ref 3.87–5.11)
RDW: 14.7 % (ref 11.5–15.5)
WBC: 12 10*3/uL — ABNORMAL HIGH (ref 4.0–10.5)

## 2015-02-09 MED ORDER — TETANUS-DIPHTH-ACELL PERTUSSIS 5-2.5-18.5 LF-MCG/0.5 IM SUSP
0.5000 mL | Freq: Once | INTRAMUSCULAR | Status: AC
  Administered 2015-02-09: 0.5 mL via INTRAMUSCULAR

## 2015-02-09 NOTE — Progress Notes (Signed)
Post Partum Day 1 Subjective: no complaints, up ad lib, voiding, tolerating PO and + flatus  Objective: Blood pressure 104/67, pulse 75, temperature 98 F (36.7 C), temperature source Oral, resp. rate 18, height 5\' 4"  (1.626 m), weight 74.39 kg (164 lb), SpO2 100 %, unknown if currently breastfeeding.  Physical Exam:  General: alert, cooperative and appears stated age Lochia: appropriate Uterine Fundus: firm  Recent Labs  02/08/15 0723 02/09/15 0600  HGB 12.2 9.7*  HCT 37.4 29.3*    Assessment/Plan: Plan for discharge tomorrow and Breastfeeding   LOS: 1 day   Glenetta Kiger H. 02/09/2015, 11:30 AM

## 2015-02-09 NOTE — Lactation Note (Signed)
This note was copied from the chart of Anna Huff. Lactation Consultation Note Mom attempted to BF her 6 yr. Old son for 2 1/2 weeks but d/t poor latching and nipple pain couldn't tolerate it. Mom has flat nipples that are bruised, Rt. W/positional stripe. Breast are filling. Hand expression taught w/noted colostrum. Encouraged to rub on nipples. Mom stated she may give bottle next feeding and let her breast rest. Stated she may pump then after that. RN gave #20 NS. I fitted for #16 NS and application taught w/information sheet given.  Shells given to wear in bra today for nipple everting. Has hand pump. Encouraged hand expression, vial given to collect colostrum with and curve tip syring.  Referred to Baby and Me Book in Breastfeeding section Pg. 22-23 for position options and Proper latch demonstration. Mom encouraged to do skin-to-skin.  Educated about newborn behavior. WH/LC brochure given w/resources, support groups and LC services. Comfort gels given for tender bruised nipples.  Patient Name: Anna Bunnie PhilipsShanna Getter Today's Date: 02/09/2015 Reason for consult: Initial assessment   Maternal Data Has patient been taught Hand Expression?: Yes Does the patient have breastfeeding experience prior to this delivery?: Yes  Feeding Feeding Type: Breast Fed Length of feed: 45 min  LATCH Score/Interventions       Type of Nipple: Flat Intervention(s): Shells;Hand pump  Comfort (Breast/Nipple): Filling, red/small blisters or bruises, mild/mod discomfort  Problem noted: Mild/Moderate discomfort Interventions (Mild/moderate discomfort): Comfort gels;Breast shields;Pre-pump if needed;Hand massage;Hand expression  Intervention(s): Breastfeeding basics reviewed;Support Pillows;Position options;Skin to skin     Lactation Tools Discussed/Used Tools: Shells;Nipple Shields;Pump;Comfort gels Nipple shield size: 16 Shell Type: Inverted Breast pump type: Manual Pump Review: Setup, frequency, and  cleaning;Milk Storage Initiated by:: Peri JeffersonL. Chick Cousins RN Date initiated:: 02/09/15   Consult Status Consult Status: Follow-up Date: 02/09/15 (pm) Follow-up type: In-patient    Huda Petrey, Diamond NickelLAURA G 02/09/2015, 7:11 AM

## 2015-02-09 NOTE — Anesthesia Postprocedure Evaluation (Signed)
  Anesthesia Post-op Note  Patient: Anna Huff  Procedure(s) Performed: * No procedures listed *  Patient Location: Mother/Baby  Anesthesia Type:Epidural  Level of Consciousness: awake and alert   Airway and Oxygen Therapy: Patient Spontanous Breathing  Post-op Pain: mild  Post-op Assessment: Post-op Vital signs reviewed, Patient's Cardiovascular Status Stable, Respiratory Function Stable, No signs of Nausea or vomiting, Pain level controlled, No headache, No residual numbness and No residual motor weakness  Post-op Vital Signs: Reviewed  Last Vitals:  Filed Vitals:   02/09/15 0419  BP: 104/67  Pulse: 75  Temp: 36.7 C  Resp: 18    Complications: No apparent anesthesia complications

## 2015-02-10 MED ORDER — OXYCODONE-ACETAMINOPHEN 5-325 MG PO TABS: 2.0000 | ORAL_TABLET | ORAL | Status: DC | PRN

## 2015-02-10 MED ORDER — DOCUSATE SODIUM 100 MG PO CAPS: 100.0000 mg | ORAL_CAPSULE | Freq: Two times a day (BID) | ORAL | Status: DC

## 2015-02-10 MED ORDER — IBUPROFEN 600 MG PO TABS: 600.0000 mg | ORAL_TABLET | Freq: Four times a day (QID) | ORAL | Status: DC | PRN

## 2015-02-10 NOTE — Discharge Summary (Signed)
Obstetric Discharge Summary Reason for Admission: induction of labor Prenatal Procedures: ultrasound Intrapartum Procedures: spontaneous vaginal delivery Postpartum Procedures: none Complications-Operative and Postpartum: 2nd degree perineal laceration HEMOGLOBIN  Date Value Ref Range Status  02/09/2015 9.7* 12.0 - 15.0 g/dL Final    Comment:    REPEATED TO VERIFY DELTA CHECK NOTED    HCT  Date Value Ref Range Status  02/09/2015 29.3* 36.0 - 46.0 % Final    Physical Exam:  General: alert, cooperative and appears stated age 54Lochia: appropriate Uterine Fundus: firm Discharge Diagnoses: Term Pregnancy-delivered  Discharge Information: Date: 02/10/2015 Activity: pelvic rest Diet: routine Medications: Ibuprofen, Colace and Percocet Condition: improved Instructions: refer to practice specific booklet Discharge to: home Follow-up Information    Follow up with Almon HerculesOSS,Emmajean Ratledge H., MD.   Specialty:  Obstetrics and Gynecology   Why:  4-6 weeks for a postpartum evaluation   Contact information:   [address] [place] [id number] [phone]       Newborn Data: Live born female  Birth Weight: 9 lb 7.3 oz (4290 g) APGAR: 9, 9  Home with mother.  Anna Saylor H. 02/10/2015, 10:56 AM

## 2016-04-14 DIAGNOSIS — K589 Irritable bowel syndrome without diarrhea: Secondary | ICD-10-CM | POA: Insufficient documentation

## 2016-04-15 DIAGNOSIS — D649 Anemia, unspecified: Secondary | ICD-10-CM | POA: Insufficient documentation

## 2017-04-21 DIAGNOSIS — H8110 Benign paroxysmal vertigo, unspecified ear: Secondary | ICD-10-CM | POA: Insufficient documentation

## 2018-05-12 ENCOUNTER — Encounter (HOSPITAL_COMMUNITY): Payer: Self-pay | Admitting: *Deleted

## 2018-05-13 ENCOUNTER — Encounter (HOSPITAL_COMMUNITY)
Admission: AD | Disposition: A | Discharge status: Home / Self Care | Payer: Self-pay | Source: Ambulatory Visit | Attending: Obstetrics and Gynecology

## 2018-05-13 ENCOUNTER — Inpatient Hospital Stay (HOSPITAL_COMMUNITY): Payer: BC Managed Care – PPO | Admitting: Anesthesiology

## 2018-05-13 ENCOUNTER — Ambulatory Visit (HOSPITAL_COMMUNITY)
Admission: AD | Admit: 2018-05-13 | Discharge: 2018-05-13 | Disposition: A | Discharge status: Home / Self Care | Payer: BC Managed Care – PPO | Source: Ambulatory Visit | Attending: Obstetrics and Gynecology | Admitting: Obstetrics and Gynecology

## 2018-05-13 ENCOUNTER — Encounter (HOSPITAL_COMMUNITY): Payer: Self-pay | Admitting: *Deleted

## 2018-05-13 ENCOUNTER — Ambulatory Visit (HOSPITAL_COMMUNITY)
Admission: RE | Admit: 2018-05-13 | Payer: BC Managed Care – PPO | Source: Ambulatory Visit | Admitting: Obstetrics and Gynecology

## 2018-05-13 DIAGNOSIS — Z8619 Personal history of other infectious and parasitic diseases: Secondary | ICD-10-CM | POA: Insufficient documentation

## 2018-05-13 DIAGNOSIS — O021 Missed abortion: Secondary | ICD-10-CM | POA: Diagnosis present

## 2018-05-13 HISTORY — PX: DILATION AND EVACUATION: SHX1459

## 2018-05-13 LAB — CBC
HEMATOCRIT: 37.6 % (ref 36.0–46.0)
Hemoglobin: 12.6 g/dL (ref 12.0–15.0)
MCH: 31.1 pg (ref 26.0–34.0)
MCHC: 33.5 g/dL (ref 30.0–36.0)
MCV: 92.8 fL (ref 78.0–100.0)
Platelets: 224 10*3/uL (ref 150–400)
RBC: 4.05 MIL/uL (ref 3.87–5.11)
RDW: 13.6 % (ref 11.5–15.5)
WBC: 7 10*3/uL (ref 4.0–10.5)

## 2018-05-13 LAB — TYPE AND SCREEN
ABO/RH(D): O POS
ANTIBODY SCREEN: NEGATIVE

## 2018-05-13 SURGERY — DILATION AND EVACUATION, UTERUS
Anesthesia: General | Site: Vagina

## 2018-05-13 MED ORDER — LIDOCAINE HCL 1 % IJ SOLN
INTRAMUSCULAR | Status: AC
  Filled 2018-05-13: qty 20

## 2018-05-13 MED ORDER — OXYCODONE HCL 5 MG PO TABS: 5.0000 mg | ORAL_TABLET | Freq: Once | ORAL | Status: DC | PRN

## 2018-05-13 MED ORDER — SOD CITRATE-CITRIC ACID 500-334 MG/5ML PO SOLN
30.0000 mL | Freq: Once | ORAL | Status: AC
  Administered 2018-05-13: 30 mL via ORAL
  Filled 2018-05-13: qty 15

## 2018-05-13 MED ORDER — FENTANYL CITRATE (PF) 100 MCG/2ML IJ SOLN: 25.0000 ug | INTRAMUSCULAR | Status: DC | PRN

## 2018-05-13 MED ORDER — LACTATED RINGERS IV BOLUS
1000.0000 mL | Freq: Once | INTRAVENOUS | Status: AC
  Administered 2018-05-13: 1000 mL via INTRAVENOUS

## 2018-05-13 MED ORDER — ACETAMINOPHEN 160 MG/5ML PO SOLN: 325.0000 mg | ORAL | Status: DC | PRN

## 2018-05-13 MED ORDER — MIDAZOLAM HCL 2 MG/2ML IJ SOLN
INTRAMUSCULAR | Status: DC | PRN
  Administered 2018-05-13: 2 mg via INTRAVENOUS

## 2018-05-13 MED ORDER — LACTATED RINGERS IV SOLN: INTRAVENOUS | Status: DC

## 2018-05-13 MED ORDER — OXYCODONE HCL 5 MG/5ML PO SOLN: 5.0000 mg | Freq: Once | ORAL | Status: DC | PRN

## 2018-05-13 MED ORDER — LACTATED RINGERS IV SOLN
INTRAVENOUS | Status: DC
  Administered 2018-05-13: 16:00:00 via INTRAVENOUS

## 2018-05-13 MED ORDER — FENTANYL CITRATE (PF) 100 MCG/2ML IJ SOLN
INTRAMUSCULAR | Status: DC | PRN
  Administered 2018-05-13: 100 ug via INTRAVENOUS

## 2018-05-13 MED ORDER — FAMOTIDINE IN NACL 20-0.9 MG/50ML-% IV SOLN
20.0000 mg | Freq: Once | INTRAVENOUS | Status: AC
  Administered 2018-05-13: 20 mg via INTRAVENOUS
  Filled 2018-05-13: qty 50

## 2018-05-13 MED ORDER — IBUPROFEN 600 MG PO TABS: 600.0000 mg | ORAL_TABLET | Freq: Four times a day (QID) | ORAL | 0 refills | Status: DC | PRN

## 2018-05-13 MED ORDER — MEPERIDINE HCL 25 MG/ML IJ SOLN: 6.2500 mg | INTRAMUSCULAR | Status: DC | PRN

## 2018-05-13 MED ORDER — ACETAMINOPHEN 325 MG PO TABS: 325.0000 mg | ORAL_TABLET | ORAL | Status: DC | PRN

## 2018-05-13 MED ORDER — LIDOCAINE HCL 1 % IJ SOLN
INTRAMUSCULAR | Status: DC | PRN
  Administered 2018-05-13: 10 mL

## 2018-05-13 MED ORDER — DEXAMETHASONE SODIUM PHOSPHATE 10 MG/ML IJ SOLN
INTRAMUSCULAR | Status: DC | PRN
  Administered 2018-05-13: 10 mg via INTRAVENOUS

## 2018-05-13 MED ORDER — KETOROLAC TROMETHAMINE 30 MG/ML IJ SOLN: 30.0000 mg | Freq: Once | INTRAMUSCULAR | Status: DC | PRN

## 2018-05-13 MED ORDER — KETOROLAC TROMETHAMINE 30 MG/ML IJ SOLN
INTRAMUSCULAR | Status: DC | PRN
  Administered 2018-05-13: 30 mg via INTRAVENOUS

## 2018-05-13 MED ORDER — ONDANSETRON HCL 4 MG/2ML IJ SOLN
INTRAMUSCULAR | Status: DC | PRN
  Administered 2018-05-13: 4 mg via INTRAVENOUS

## 2018-05-13 MED ORDER — HYDROCODONE-ACETAMINOPHEN 5-325 MG PO TABS: ORAL_TABLET | ORAL | 0 refills | Status: DC

## 2018-05-13 MED ORDER — ONDANSETRON HCL 4 MG/2ML IJ SOLN: 4.0000 mg | Freq: Once | INTRAMUSCULAR | Status: DC | PRN

## 2018-05-13 MED ORDER — PROPOFOL 10 MG/ML IV BOLUS
INTRAVENOUS | Status: DC | PRN
  Administered 2018-05-13: 150 mg via INTRAVENOUS

## 2018-05-13 SURGICAL SUPPLY — 18 items
CATH ROBINSON RED A/P 16FR (CATHETERS) ×3 IMPLANT
DECANTER SPIKE VIAL GLASS SM (MISCELLANEOUS) ×3 IMPLANT
DILATOR CANAL MILEX (MISCELLANEOUS) IMPLANT
GLOVE BIO SURGEON STRL SZ7 (GLOVE) ×3 IMPLANT
GLOVE BIOGEL PI IND STRL 7.0 (GLOVE) ×1 IMPLANT
GLOVE BIOGEL PI INDICATOR 7.0 (GLOVE) ×2
GOWN STRL REUS W/TWL LRG LVL3 (GOWN DISPOSABLE) ×6 IMPLANT
KIT BERKELEY 1ST TRIMESTER 3/8 (MISCELLANEOUS) ×3 IMPLANT
NS IRRIG 1000ML POUR BTL (IV SOLUTION) ×3 IMPLANT
PACK VAGINAL MINOR WOMEN LF (CUSTOM PROCEDURE TRAY) ×3 IMPLANT
PAD OB MATERNITY 4.3X12.25 (PERSONAL CARE ITEMS) ×3 IMPLANT
PAD PREP 24X48 CUFFED NSTRL (MISCELLANEOUS) ×3 IMPLANT
SET BERKELEY SUCTION TUBING (SUCTIONS) ×3 IMPLANT
TOWEL OR 17X24 6PK STRL BLUE (TOWEL DISPOSABLE) ×6 IMPLANT
VACURETTE 10 RIGID CVD (CANNULA) IMPLANT
VACURETTE 7MM CVD STRL WRAP (CANNULA) ×3 IMPLANT
VACURETTE 8 RIGID CVD (CANNULA) IMPLANT
VACURETTE 9 RIGID CVD (CANNULA) IMPLANT

## 2018-05-13 NOTE — Discharge Instructions (Signed)
DISCHARGE INSTRUCTIONS: D&C / D&E The following instructions have been prepared to help you care for yourself upon your return home.   Personal hygiene:  Use sanitary pads for vaginal drainage, not tampons.  Shower the day after your procedure.  NO tub baths, pools or Jacuzzis for 2-3 weeks.  Wipe front to back after using the bathroom.  Activity and limitations:  Do NOT drive or operate any equipment for 24 hours. The effects of anesthesia are still present and drowsiness may result.  Do NOT rest in bed all day.  Walking is encouraged.  Walk up and down stairs slowly.  You may resume your normal activity in one to two days or as indicated by your physician.  Sexual activity: NO intercourse for at least 2 weeks after the procedure, or as indicated by your physician.  Diet: Eat a light meal as desired this evening. You may resume your usual diet tomorrow.  Return to work: You may resume your work activities in one to two days or as indicated by your doctor.  What to expect after your surgery: Expect to have vaginal bleeding/discharge for 2-3 days and spotting for up to 10 days. It is not unusual to have soreness for up to 1-2 weeks. You may have a slight burning sensation when you urinate for the first day. Mild cramps may continue for a couple of days. You may have a regular period in 2-6 weeks.  Call your doctor for any of the following:  Excessive vaginal bleeding, saturating and changing one pad every hour.  Inability to urinate 6 hours after discharge from hospital.  Pain not relieved by pain medication.  Fever of 100.4 F or greater.  Unusual vaginal discharge or odor.   Call for an appointment:    Patients signature: ______________________  Nurses signature ________________________  Support person's signature_______________________   Post Anesthesia Home Care Instructions  NO IBUPROFEN PRODUCTS UNTIL: 10:30 PM TONIGHT  Activity: Get plenty of rest  for the remainder of the day. A responsible individual must stay with you for 24 hours following the procedure.  For the next 24 hours, DO NOT: -Drive a car -Advertising copywriterperate machinery -Drink alcoholic beverages -Take any medication unless instructed by your physician -Make any legal decisions or sign important papers.  Meals: Start with liquid foods such as gelatin or soup. Progress to regular foods as tolerated. Avoid greasy, spicy, heavy foods. If nausea and/or vomiting occur, drink only clear liquids until the nausea and/or vomiting subsides. Call your physician if vomiting continues.  Special Instructions/Symptoms: Your throat may feel dry or sore from the anesthesia or the breathing tube placed in your throat during surgery. If this causes discomfort, gargle with warm salt water. The discomfort should disappear within 24 hours.

## 2018-05-13 NOTE — Anesthesia Postprocedure Evaluation (Signed)
Anesthesia Post Note  Patient: Anna Huff  Procedure(s) Performed: DILATATION AND EVACUATION (N/A Vagina )     Patient location during evaluation: PACU Anesthesia Type: General Level of consciousness: awake Pain management: pain level controlled Vital Signs Assessment: post-procedure vital signs reviewed and stable Respiratory status: spontaneous breathing Cardiovascular status: stable Postop Assessment: no apparent nausea or vomiting Anesthetic complications: no    Last Vitals:  Vitals:   05/13/18 1730 05/13/18 1745  BP:    Pulse: 71   Resp: (!) 21   Temp:  36.8 C  SpO2: 100%     Last Pain:  Vitals:   05/13/18 1745  PainSc: 0-No pain   Pain Goal: Patients Stated Pain Goal: 3 (05/13/18 1745)               Gerilynn Mccullars JR,JOHN Susann GivensFRANKLIN

## 2018-05-13 NOTE — Anesthesia Preprocedure Evaluation (Signed)
Anesthesia Evaluation  Patient identified by MRN, date of birth, ID band Patient awake    Reviewed: Allergy & Precautions, H&P , NPO status , Patient's Chart, lab work & pertinent test results  Airway Mallampati: I  TM Distance: >3 FB Neck ROM: full    Dental no notable dental hx. (+) Teeth Intact   Pulmonary neg pulmonary ROS,    Pulmonary exam normal breath sounds clear to auscultation       Cardiovascular negative cardio ROS Normal cardiovascular exam Rhythm:Regular Rate:Normal     Neuro/Psych negative neurological ROS  negative psych ROS   GI/Hepatic negative GI ROS, Neg liver ROS,   Endo/Other  negative endocrine ROS  Renal/GU negative Renal ROS  negative genitourinary   Musculoskeletal negative musculoskeletal ROS (+)   Abdominal Normal abdominal exam  (+)   Peds  Hematology negative hematology ROS (+)   Anesthesia Other Findings   Reproductive/Obstetrics (+) Pregnancy                             Anesthesia Physical Anesthesia Plan  ASA: II  Anesthesia Plan: General   Post-op Pain Management:    Induction: Intravenous  PONV Risk Score and Plan:   Airway Management Planned: LMA  Additional Equipment:   Intra-op Plan:   Post-operative Plan:   Informed Consent: I have reviewed the patients History and Physical, chart, labs and discussed the procedure including the risks, benefits and alternatives for the proposed anesthesia with the patient or authorized representative who has indicated his/her understanding and acceptance.   Dental advisory given  Plan Discussed with: CRNA and Surgeon  Anesthesia Plan Comments:         Anesthesia Quick Evaluation

## 2018-05-13 NOTE — MAU Note (Signed)
Pt here for D&C. PReop

## 2018-05-13 NOTE — Op Note (Signed)
Pre-Operative Diagnosis: 1) 6 week missed miscariage Postoperative Diagnosis: 1) 6 week missed miscariage Procedure: Suction Dilation and Evacuation Surgeon: Dr. Waynard ReedsKendra Kamyrah Feeser Assistant: None Operative Findings: Antevertus uterus Specimen: Products of conception EBL: Total I/O In: 1000 [I.V.:1000] Out: 120 [Urine:100; Blood:20]   Ms. Anna Huff Is a 37 year old gravida 4 para 2012 who presents for definitive surgical management for missed miscarriage. Please see the patient's history and physical for complete details of the history. Management options were discussed with the patient. R/B/A reviewed. Following appropriate informed consent was taken to the operating room. The patient was appropriately identified during a time out procedure. General anesthesia was administered and the patient was placed in the dorsal lithotomy position. The patient was prepped and draped in the normal sterile fashion. A speculum was placed into the vagina, a single-tooth tenaculum was placed on the anterior lip of the cervix, and 10 cc of 1% lidocaine was administered in a paracervical fashion. The cervix was serially dilated with Hank dilators. A #7 suction curet was then passed to the fundus, the vacuum was engaged, and 3 suction passes were performed with a curette. A Sharp curettage was performed and a gritty texture was noted. A final suction pass was performed with minimal results. This completed the procedure. The patient tolerated the procedure well was brought to the recovery room in stable condition for the procedure. All sponge and needle counts correct x2.

## 2018-05-13 NOTE — H&P (Signed)
Bunnie PhilipsShanna Hankinson is an 37 y.o. female.   36 [id number] presents for definitive surgical management of a missed miscarriage.  The patient should be 7+ weeks pregnant. At her initial visit an ultrasound showed an IUP with CRL at 6+1 with FHR 90. A repeat US 1 week later showed no interval growth of the fetus an fetal cardiac activity could no longer be visualized. Findings were consistent with a missed miscarriage. Management options were reviewed with the patient and she wished to proceed with surgical management. R/B/A reviewed   Menstrual History: No LMP recorded.    Past Medical History:  Diagnosis Date  . Hx of varicella   . Medical history non-contributory     Past Surgical History:  Procedure Laterality Date  . COLONOSCOPY    . NO PAST SURGERIES    . TONSILLECTOMY      Family History  Problem Relation Age of Onset  . Cancer Maternal Grandmother        lung  . Diabetes Paternal Grandmother     Social History:  reports that she has never smoked. She has never used smokeless tobacco. She reports that she does not drink alcohol or use drugs.  Allergies: No Known Allergies  No medications prior to admission.    ROS  unknown if currently breastfeeding. Physical Exam   AOX3 Normal work of breathing abd soft  Blood type O pos  No results found for this or any previous visit (from the past 24 hour(s)).  No results found.  Assessment/Plan: 1) Admit 2) SCDs for DVT prophylaxis 3) Informed consent obtained  Waynard ReedsKendra Kayen Grabel 05/13/2018, 3:14 PM

## 2018-05-13 NOTE — Anesthesia Procedure Notes (Signed)
Procedure Name: LMA Insertion Date/Time: 05/13/2018 4:18 PM Performed by: Armanda HeritageSterling, Nailyn Dearinger M, CRNA Pre-anesthesia Checklist: Patient identified, Emergency Drugs available, Suction available, Patient being monitored and Timeout performed Patient Re-evaluated:Patient Re-evaluated prior to induction Oxygen Delivery Method: Circle system utilized Preoxygenation: Pre-oxygenation with 100% oxygen Induction Type: IV induction LMA: LMA inserted LMA Size: 4.0 Number of attempts: 1 Placement Confirmation: positive ETCO2 and breath sounds checked- equal and bilateral ETT to lip (cm): at lips. Dental Injury: Teeth and Oropharynx as per pre-operative assessment

## 2018-05-13 NOTE — Transfer of Care (Signed)
Immediate Anesthesia Transfer of Care Note  Patient: Anna Huff  Procedure(s) Performed: DILATATION AND EVACUATION (N/A Vagina )  Patient Location: PACU  Anesthesia Type:General  Level of Consciousness: awake, alert  and oriented  Airway & Oxygen Therapy: Patient Spontanous Breathing and Patient connected to nasal cannula oxygen  Post-op Assessment: Report given to RN and Post -op Vital signs reviewed and stable  Post vital signs: Reviewed and stable  Last Vitals:  Vitals Value Taken Time  BP    Temp    Pulse 122 05/13/2018  4:51 PM  Resp 13 05/13/2018  4:51 PM  SpO2 100 % 05/13/2018  4:51 PM  Vitals shown include unvalidated device data.  Last Pain: There were no vitals filed for this visit.       Complications: No apparent anesthesia complications

## 2018-05-14 ENCOUNTER — Encounter (HOSPITAL_COMMUNITY): Payer: Self-pay | Admitting: Obstetrics and Gynecology

## 2019-01-18 ENCOUNTER — Ambulatory Visit: Payer: Self-pay

## 2019-01-18 ENCOUNTER — Ambulatory Visit: Payer: BC Managed Care – PPO | Admitting: Family Medicine

## 2019-01-18 ENCOUNTER — Encounter: Payer: Self-pay | Admitting: Family Medicine

## 2019-01-18 VITALS — BP 100/76 | HR 87 | Ht 64.0 in | Wt 141.0 lb

## 2019-01-18 DIAGNOSIS — M7050 Other bursitis of knee, unspecified knee: Secondary | ICD-10-CM | POA: Diagnosis not present

## 2019-01-18 DIAGNOSIS — M25561 Pain in right knee: Principal | ICD-10-CM

## 2019-01-18 DIAGNOSIS — G8929 Other chronic pain: Secondary | ICD-10-CM

## 2019-01-18 DIAGNOSIS — Z832 Family history of diseases of the blood and blood-forming organs and certain disorders involving the immune mechanism: Secondary | ICD-10-CM | POA: Diagnosis not present

## 2019-01-18 DIAGNOSIS — M1120 Other chondrocalcinosis, unspecified site: Secondary | ICD-10-CM

## 2019-01-18 HISTORY — DX: Other bursitis of knee, unspecified knee: M70.50

## 2019-01-18 MED ORDER — DICLOFENAC SODIUM 2 % TD SOLN: 2.0000 g | Freq: Two times a day (BID) | TRANSDERMAL | 3 refills | Status: DC

## 2019-01-18 MED ORDER — VITAMIN D (ERGOCALCIFEROL) 1.25 MG (50000 UNIT) PO CAPS: 50000.0000 [IU] | ORAL_CAPSULE | ORAL | 0 refills | Status: DC

## 2019-01-18 NOTE — Assessment & Plan Note (Signed)
Mild noted in patient's right medial meniscus.  No true tear appreciated.  Discussed diet and over-the-counter medications and could be beneficial.

## 2019-01-18 NOTE — Patient Instructions (Signed)
Great to meet Anna Huff better half Ice 20 minutes 2 times daily. Usually after activity and before bed. Exercises 3 times a week.  Thigh compression sleeve with running  pennsaid pinkie amount topically 2 times daily as needed.  Once weekly vitamin D Over the counter get turmeric 500mg  daily  Tart cherry extract may help with the little psuedo gout.  Spenco orthotics "total support" online would be great  Or shoes look at asics, adidas or newtons.  See me again in 4-5 weeks

## 2019-01-18 NOTE — Assessment & Plan Note (Signed)
Right side.  Discussed icing regimen, home exercise, thigh compression, running gait, shoes.  Topical anti-inflammatories and once weekly vitamin D prescribed.  Follow-up again in 4 weeks

## 2019-01-18 NOTE — Progress Notes (Signed)
[name] [address] [place], [place] [address] Phone: [phone] Subjective:    I Ronelle Nigh am serving as a Neurosurgeon for Dr. Antoine Primas.   I'm seeing this patient by the request  of:    CC: Right knee pain  [id number]  Anna Huff is a 38 y.o. female coming in with complaint of right knee pain. States she doesn't remember what happened. Pain is not constant. Initially had swelling. 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 Patellar straps help. Fractured growth plate in left knee during soccer.   Onset- 3.5 weeks ago Location- Knee cap   Character- sharp  Aggravating factors- lifting the leg, turning, pressure on the knee cap Reliving factors-  Therapies tried- pain meds, ice at night  Severity-presented 10.     Past Medical History:  Diagnosis Date  . Hx of varicella   . Medical history non-contributory    Past Surgical History:  Procedure Laterality Date  . COLONOSCOPY    . DILATION AND EVACUATION N/A 05/13/2018   Procedure: DILATATION AND EVACUATION;  Surgeon: Waynard Reeds, MD;  Location: [facility];  Service: Gynecology;  Laterality: N/A;  . NO PAST SURGERIES    . TONSILLECTOMY     Social History   Socioeconomic History  . Marital status: Married    Spouse name: Not on file  . Number of children: Not on file  . Years of education: Not on file  . Highest education level: Not on file  Occupational History  . Not on file  Social Needs  . Financial resource strain: Not on file  . Food insecurity:    Worry: Not on file    Inability: Not on file  . Transportation needs:    Medical: Not on file    Non-medical: Not on file  Tobacco Use  . Smoking status: Never Smoker  . Smokeless tobacco: Never Used  Substance and Sexual Activity  . Alcohol use: Yes    Comment: occassional  . Drug use: No  . Sexual activity: Yes  Lifestyle  . Physical activity:    Days per week: Not on file    Minutes per session: Not on file    . Stress: Not on file  Relationships  . Social connections:    Talks on phone: Not on file    Gets together: Not on file    Attends religious service: Not on file    Active member of club or organization: Not on file    Attends meetings of clubs or organizations: Not on file    Relationship status: Not on file  Other Topics Concern  . Not on file  Social History Narrative  . Not on file   No Known Allergies Family History  Problem Relation Age of Onset  . Cancer Maternal Grandmother        lung  . Diabetes Paternal Grandmother        Current Outpatient Medications (Analgesics):  .  HYDROcodone-acetaminophen (NORCO/VICODIN) 5-325 MG tablet, 1-2 tablets every 4-6 hours as needed for pain .  ibuprofen (ADVIL,MOTRIN) 600 MG tablet, Take 1 tablet (600 mg total) by mouth every 6 (six) hours as needed.      Past medical history, social, surgical and family history all reviewed in electronic medical record.  No pertanent information unless stated regarding to the chief complaint.   Review of Systems:  No headache, visual changes, nausea, vomiting, diarrhea, constipation, dizziness, abdominal pain, skin rash, fevers, chills, night sweats,  weight loss, swollen lymph nodes, body aches, joint swelling, muscle aches, chest pain, shortness of breath, mood changes.   Objective  unknown if currently breastfeeding. Systems examined below as of    General: No apparent distress alert and oriented x3 mood and affect normal, dressed appropriately.  HEENT: Pupils equal, extraocular movements intact  Respiratory: Patient's speak in full sentences and does not appear short of breath  Cardiovascular: No lower extremity edema, non tender, no erythema  Skin: Warm dry intact with no signs of infection or rash on extremities or on axial skeleton.  Abdomen: Soft nontender  Neuro: Cranial nerves II through XII are intact, neurovascularly intact in all extremities with 2+ DTRs and 2+ pulses.   Lymph: No lymphadenopathy of posterior or anterior cervical chain or axillae bilaterally.  Gait normal with Summer balance and coordination.  MSK:  Non tender with full range of motion and Robertshaw stability and symmetric strength and tone of shoulders, elbows, wrist, hip, and ankles bilaterally.  Knee: Right Normal to inspection with no erythema or effusion or obvious bony abnormalities. Pain more over the peds anserine ROM full in flexion and extension and lower leg rotation. Ligaments with solid consistent endpoints including ACL, PCL, LCL, MCL. Negative Mcmurray's, Apley's, and Thessalonian tests. Mild painful patellar compression. Patellar glide without crepitus. Patellar and quadriceps tendons unremarkable. Hamstring and quadriceps strength is normal. Contralateral knee unremarkable  Severe narrow feet noted  MSK US performed of: Right knee This study was ordered, performed, and interpreted by Terrilee Files D.O.  Knee: Patient's knee has what appears to be pseudogout within the medial meniscus.  Patient does have some mild narrowing of the joint space.  Also has what appears to be mild thinning of the bone noted.  Severe past anserine bursitis noted with increasing hypoechoic changes in Doppler flow   IMPRESSION: Pedis anserine bursitis with 1 appears to be chondrocalcinosis   Impression and Recommendations:     This case required medical decision making of moderate complexity. The above documentation has been reviewed and is accurate and complete Judi Saa, DO       Note: This dictation was prepared with Dragon dictation along with smaller phrase technology. Any transcriptional errors that result from this process are unintentional.

## 2019-02-22 ENCOUNTER — Ambulatory Visit: Payer: BC Managed Care – PPO | Admitting: Family Medicine

## 2019-04-09 ENCOUNTER — Other Ambulatory Visit: Payer: Self-pay | Admitting: Family Medicine

## 2019-05-12 ENCOUNTER — Encounter (HOSPITAL_COMMUNITY): Payer: Self-pay

## 2019-06-15 ENCOUNTER — Encounter: Payer: Self-pay | Admitting: Family Medicine

## 2019-06-15 ENCOUNTER — Ambulatory Visit: Payer: BC Managed Care – PPO | Admitting: Family Medicine

## 2019-06-15 ENCOUNTER — Other Ambulatory Visit: Payer: Self-pay

## 2019-06-15 DIAGNOSIS — M1120 Other chondrocalcinosis, unspecified site: Secondary | ICD-10-CM

## 2019-06-15 NOTE — Assessment & Plan Note (Signed)
Patient seems to be doing much better.  Patient did not want to chondrocalcinosis.  Discussed vitamin D and will continue that at the moment.  Seems to be making improvement.  We discussed the possibility laboratory work-up but patient wanted to hold at this time.  Patient is well can follow-up as needed

## 2019-06-15 NOTE — Progress Notes (Signed)
[facility] [place], [place] [address] Phone: [phone] Subjective:   Anna Huff, am serving as a scribe for Dr. Hulan Saas.  I'm seeing this patient by the request  of:    CC:  Knee pain follow up   [id number]   01/18/2019: Right side.  Discussed icing regimen, home exercise, thigh compression, running gait, shoes.  Topical anti-inflammatories and once weekly vitamin D prescribed.  Follow-up again in 4 weeks  Mild noted in patient's right medial meniscus.  Huff true tear appreciated.  Discussed diet and over-the-counter medications and could be beneficial.  Update 06/15/2019: Anna Huff is a 38 y.o. female coming in with complaint of right knee pain. States that her knee is better. Is taking supplements. Feels weakness. Has been doing HEP. Did get new shoes. Tried compression sleeve but liked the patella stabilizer better.  Patient would state that she is 80% better at this time.    Past Medical History:  Diagnosis Date  . Hx of varicella   . Medical history non-contributory    Past Surgical History:  Procedure Laterality Date  . COLONOSCOPY    . DILATION AND EVACUATION N/A 05/13/2018   Procedure: DILATATION AND EVACUATION;  Surgeon: Vanessa Kick, MD;  Location: [facility];  Service: Gynecology;  Laterality: N/A;  . Huff PAST SURGERIES    . TONSILLECTOMY     Social History   Socioeconomic History  . Marital status: Married    Spouse name: Not on file  . Number of children: Not on file  . Years of education: Not on file  . Highest education level: Not on file  Occupational History  . Not on file  Social Needs  . Financial resource strain: Not on file  . Food insecurity    Worry: Not on file    Inability: Not on file  . Transportation needs    Medical: Not on file    Non-medical: Not on file  Tobacco Use  . Smoking status: Never Smoker  . Smokeless tobacco: Never Used  Substance and Sexual Activity  . Alcohol use: Yes     Comment: occassional  . Drug use: Huff  . Sexual activity: Yes  Lifestyle  . Physical activity    Days per week: Not on file    Minutes per session: Not on file  . Stress: Not on file  Relationships  . Social Herbalist on phone: Not on file    Gets together: Not on file    Attends religious service: Not on file    Active member of club or organization: Not on file    Attends meetings of clubs or organizations: Not on file    Relationship status: Not on file  Other Topics Concern  . Not on file  Social History Narrative  . Not on file   Huff Known Allergies Family History  Problem Relation Age of Onset  . Cancer Maternal Grandmother        lung  . Diabetes Paternal Grandmother     Current Outpatient Medications (Endocrine & Metabolic):  Marland Kitchen  NUVARING 0.12-0.015 MG/24HR vaginal ring,       Current Outpatient Medications (Other):  Marland Kitchen  Diclofenac Sodium (PENNSAID) 2 % SOLN, Place 2 g onto the skin 2 (two) times daily. .  Vitamin D, Ergocalciferol, (DRISDOL) 1.25 MG (50000 UT) CAPS capsule, TAKE 1 CAPSULE (50,000 UNITS TOTAL) BY MOUTH EVERY 7 (SEVEN) DAYS.    Past medical  history, social, surgical and family history all reviewed in electronic medical record.  Huff pertanent information unless stated regarding to the chief complaint.   Review of Systems:  Huff headache, visual changes, nausea, vomiting, diarrhea, constipation, dizziness, abdominal pain, skin rash, fevers, chills, night sweats, weight loss, swollen lymph nodes, body aches, joint swelling, muscle aches, chest pain, shortness of breath, mood changes.   Objective  unknown if currently breastfeeding. Systems examined below as of    General: Huff apparent distress alert and oriented x3 mood and affect normal, dressed appropriately.  HEENT: Pupils equal, extraocular movements intact  Respiratory: Patient's speak in full sentences and does not appear short of breath  Cardiovascular: Huff lower extremity edema,  non tender, Huff erythema  Skin: Warm dry intact with Huff signs of infection or rash on extremities or on axial skeleton.  Abdomen: Soft nontender  Neuro: Cranial nerves II through XII are intact, neurovascularly intact in all extremities with 2+ DTRs and 2+ pulses.  Lymph: Huff lymphadenopathy of posterior or anterior cervical chain or axillae bilaterally.  Gait normal with [place] balance and coordination.  MSK:  Non tender with full range of motion and Yeoman stability and symmetric strength and tone of shoulders, elbows, wrist, hip, and ankles bilaterally.   Patient's right knee has very minimal tenderness over the medial joint line otherwise fairly well.  Full range of motion.  Minimal crepitus with range of motion of the patella.  Patient hamstring still minorly tight on the right compared to the left.  Negative straight leg test    Impression and Recommendations:      The above documentation has been reviewed and is accurate and complete Judi SaaZachary M , DO       Note: This dictation was prepared with Dragon dictation along with smaller phrase technology. Any transcriptional errors that result from this process are unintentional.

## 2019-06-30 ENCOUNTER — Other Ambulatory Visit: Payer: Self-pay | Admitting: Family Medicine

## 2019-11-27 ENCOUNTER — Other Ambulatory Visit: Payer: Self-pay

## 2019-11-27 ENCOUNTER — Other Ambulatory Visit (INDEPENDENT_AMBULATORY_CARE_PROVIDER_SITE_OTHER): Payer: BC Managed Care – PPO

## 2019-11-27 ENCOUNTER — Telehealth: Payer: Self-pay | Admitting: *Deleted

## 2019-11-27 DIAGNOSIS — M2550 Pain in unspecified joint: Secondary | ICD-10-CM

## 2019-11-27 LAB — FERRITIN: Ferritin: 21.8 ng/mL (ref 10.0–291.0)

## 2019-11-27 LAB — CBC WITH DIFFERENTIAL/PLATELET
Basophils Absolute: 0 10*3/uL (ref 0.0–0.1)
Basophils Relative: 0.6 % (ref 0.0–3.0)
Eosinophils Absolute: 0.1 10*3/uL (ref 0.0–0.7)
Eosinophils Relative: 1.3 % (ref 0.0–5.0)
HCT: 40 % (ref 36.0–46.0)
Hemoglobin: 13.3 g/dL (ref 12.0–15.0)
Lymphocytes Relative: 27.1 % (ref 12.0–46.0)
Lymphs Abs: 1.7 10*3/uL (ref 0.7–4.0)
MCHC: 33.1 g/dL (ref 30.0–36.0)
MCV: 94.9 fl (ref 78.0–100.0)
Monocytes Absolute: 0.5 10*3/uL (ref 0.1–1.0)
Monocytes Relative: 8.2 % (ref 3.0–12.0)
Neutro Abs: 4 10*3/uL (ref 1.4–7.7)
Neutrophils Relative %: 62.8 % (ref 43.0–77.0)
Platelets: 224 10*3/uL (ref 150.0–400.0)
RBC: 4.21 Mil/uL (ref 3.87–5.11)
RDW: 13.2 % (ref 11.5–15.5)
WBC: 6.4 10*3/uL (ref 4.0–10.5)

## 2019-11-27 LAB — IBC PANEL
Iron: 93 ug/dL (ref 42–145)
Saturation Ratios: 26.7 % (ref 20.0–50.0)
Transferrin: 249 mg/dL (ref 212.0–360.0)

## 2019-11-27 LAB — SEDIMENTATION RATE: Sed Rate: 2 mm/hr (ref 0–20)

## 2019-11-27 LAB — TSH: TSH: 1.42 u[IU]/mL (ref 0.35–4.50)

## 2019-11-27 LAB — VITAMIN D 25 HYDROXY (VIT D DEFICIENCY, FRACTURES): VITD: 59.98 ng/mL (ref 30.00–100.00)

## 2019-11-27 LAB — URIC ACID: Uric Acid, Serum: 3.9 mg/dL (ref 2.4–7.0)

## 2019-11-27 LAB — C-REACTIVE PROTEIN: CRP: <1 mg/dL (ref 0.5–20.0)

## 2019-11-27 NOTE — Telephone Encounter (Signed)
Patient recently has had work-up for more of a benign positional vertigo.  Continues to have significant difficulty.  Has been treated for this intermittently since April 2018.  Patient states that at this moment she would like to consider other reasons for return to have this difficulty.  Patient's last laboratory work-up was in November 2019.  I would like to start with laboratory work-up and then likely see patient in office.  At the end of this if patient continues to have difficulty I would like to consider the possibility of MRI brain.

## 2019-11-27 NOTE — Telephone Encounter (Signed)
Pt called stating she had recent imaging done and was told that they are concerned for an autoimmune disease. Pt would like to have labs ordered.

## 2019-11-28 ENCOUNTER — Encounter: Payer: Self-pay | Admitting: Family Medicine

## 2019-11-28 LAB — COMPREHENSIVE METABOLIC PANEL
ALT: 10 U/L (ref 0–35)
AST: 14 U/L (ref 0–37)
Albumin: 4.7 g/dL (ref 3.5–5.2)
Alkaline Phosphatase: 75 U/L (ref 39–117)
BUN: 10 mg/dL (ref 6–23)
CO2: 26 mEq/L (ref 19–32)
Calcium: 9.4 mg/dL (ref 8.4–10.5)
Chloride: 103 mEq/L (ref 96–112)
Creatinine, Ser: 0.74 mg/dL (ref 0.40–1.20)
GFR: 88.76 mL/min (ref >60.00)
Glucose, Bld: 98 mg/dL (ref 70–99)
Potassium: 3.5 mEq/L (ref 3.5–5.1)
Sodium: 140 mEq/L (ref 135–145)
Total Bilirubin: 0.3 mg/dL (ref 0.2–1.2)
Total Protein: 6.9 g/dL (ref 6.0–8.3)

## 2019-11-29 ENCOUNTER — Encounter: Payer: Self-pay | Admitting: Family Medicine

## 2019-11-29 ENCOUNTER — Ambulatory Visit: Payer: BC Managed Care – PPO | Admitting: Family Medicine

## 2019-11-29 ENCOUNTER — Other Ambulatory Visit: Payer: BC Managed Care – PPO

## 2019-11-29 ENCOUNTER — Other Ambulatory Visit: Payer: Self-pay

## 2019-11-29 VITALS — BP 100/60 | HR 80 | Ht 64.0 in | Wt 143.0 lb

## 2019-11-29 DIAGNOSIS — R42 Dizziness and giddiness: Secondary | ICD-10-CM

## 2019-11-29 LAB — PTH, INTACT AND CALCIUM
Calcium: 9.4 mg/dL (ref 8.6–10.2)
PTH: 34 pg/mL (ref 14–64)

## 2019-11-29 LAB — RHEUMATOID FACTOR: Rheumatoid fact SerPl-aCnc: <14 IU/mL (ref <14)

## 2019-11-29 LAB — CALCIUM, IONIZED: Calcium, Ion: 4.78 mg/dL — ABNORMAL LOW (ref 4.8–5.6)

## 2019-11-29 LAB — ANGIOTENSIN CONVERTING ENZYME: Angiotensin-Converting Enzyme: 6 U/L — ABNORMAL LOW (ref 9–67)

## 2019-11-29 LAB — ANTI-NUCLEAR AB-TITER (ANA TITER): ANA Titer 1: 1:160 {titer} — ABNORMAL HIGH

## 2019-11-29 LAB — ANA: Anti Nuclear Antibody (ANA): POSITIVE — AB

## 2019-11-29 LAB — CYCLIC CITRUL PEPTIDE ANTIBODY, IGG: Cyclic Citrullin Peptide Ab: <16 UNITS

## 2019-11-29 NOTE — Assessment & Plan Note (Signed)
Patient is having vertigo symptoms.  Does have a positive ANA and a family history of autoimmune that is somewhat concerning.  Does not though appear to be more of a vasculitis with what patient describes as the out side testing.  Concern for more of a neurogenic type.  These do seem to be recurrent but seems to be worsening over the course the last 3 months with increasing severity, duration and frequency.  No recent illnesses or viral causes recently per patient so likely not a vestibular neuritis questionable vertebral migraines but at this point I do think we need to rule out any central pathology with an MRI of the brain as well as MRI of the neck to rule out any demyelinating aspect.  Patient will start to increase activity slowly.  Patient will have the imaging and we discussed further work-up if necessary which would likely include referral to rheumatology

## 2019-11-29 NOTE — Progress Notes (Signed)
Anna Huff [address] [place], [place] [address] Phone: [phone] Subjective:   I Anna Huff am serving as a Neurosurgeon for Dr. Antoine Primas.  This visit occurred during the SARS-CoV-2 public health emergency.  Safety protocols were in place, including screening questions prior to the visit, additional usage of staff PPE, and extensive cleaning of exam room while observing appropriate contact time as indicated for disinfecting solutions.   I  CC: Difficulty with balance  [id number]   06/15/2019 Patient seems to be doing much better.  Patient did not want to chondrocalcinosis.  Discussed vitamin D and will continue that at the moment.  Seems to be making improvement.  We discussed the possibility laboratory work-up but patient wanted to hold at this time.  Patient is well can follow-up as needed  11/29/2019 Anna Huff is a 38 y.o. female coming in with complaint of dizziness. ENT week before last. Bilateral vestibular loss. Has tried PT.   Onset- 2.5 years that progressively got worse   Duration- constant  Character-significant dizziness and causes patient to even follow for Aggravating factors- stairs, crowed places, darkness, hallways, closing her eyes  Reliving factors-sometimes changing position Therapies tried- PT Severity-worsening over the course the last 3 months   Recent laboratory work-up showed mild patient was unremarkable for any abnormalities in negative for screening for autoimmune diseases previously but does have a positive ANA recently with a 1-160 titer.  She patient has been seen by ENT and was told it was more of allergies.  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 Hx of varicella   . Medical history non-contributory    Past Surgical History:  Procedure Laterality Date  . COLONOSCOPY    . DILATION AND EVACUATION N/A 05/13/2018   Procedure:  DILATATION AND EVACUATION;  Surgeon: Waynard Reeds, MD;  Location: [facility];  Service: Gynecology;  Laterality: N/A;  . NO PAST SURGERIES    . TONSILLECTOMY     Social History   Socioeconomic History  . Marital status: Married    Spouse name: Not on file  . Number of children: Not on file  . Years of education: Not on file  . Highest education level: Not on file  Occupational History  . Not on file  Tobacco Use  . Smoking status: Never Smoker  . Smokeless tobacco: Never Used  Substance and Sexual Activity  . Alcohol use: Yes    Comment: occassional  . Drug use: No  . Sexual activity: Yes  Other Topics Concern  . Not on file  Social History Narrative  . Not on file   Social Determinants of Health   Financial Resource Strain:   . Difficulty of Paying Living Expenses: Not on file  Food Insecurity:   . Worried About Programme researcher, broadcasting/film/video in the Last Year: Not on file  . Ran Out of Food in the Last Year: Not on file  Transportation Needs:   . Lack of Transportation (Medical): Not on file  . Lack of Transportation (Non-Medical): Not on file  Physical Activity:   . Days of Exercise per Week: Not on file  . Minutes of Exercise per Session: Not on file  Stress:   . Feeling of Stress : Not on file  Social Connections:   . Frequency of Communication with Friends and Family: Not on file  . Frequency of Social Gatherings with Friends  and Family: Not on file  . Attends Religious Services: Not on file  . Active Member of Clubs or Organizations: Not on file  . Attends Archivist Meetings: Not on file  . Marital Status: Not on file   No Known Allergies Family History  Problem Relation Age of Onset  . Cancer Maternal Grandmother        lung  . Diabetes Paternal Grandmother     Current Outpatient Medications (Endocrine & Metabolic):  Marland Kitchen  NUVARING 0.12-0.015 MG/24HR vaginal ring,       Current Outpatient Medications (Other):  Marland Kitchen  Diclofenac Sodium (PENNSAID) 2 % SOLN,  Place 2 g onto the skin 2 (two) times daily. .  Vitamin D, Ergocalciferol, (DRISDOL) 1.25 MG (50000 UT) CAPS capsule, TAKE 1 CAPSULE (50,000 UNITS TOTAL) BY MOUTH EVERY 7 (SEVEN) DAYS.    Past medical history, social, surgical and family history all reviewed in electronic medical record.  No pertanent information unless stated regarding to the chief complaint.   Review of Systems:  No headache, visual changes, nausea, vomiting, diarrhea, constipation,  abdominal pain, skin rash, fevers, chills, night sweats, weight loss, swollen lymph nodes, body aches, joint swelling, muscle aches, chest pain, shortness of breath, mood changes.  Positive dizziness  Objective  Blood pressure 100/60, pulse 80, height 5\' 4"  (1.626 m), weight 143 lb (64.9 kg), SpO2 99 %, unknown if currently breastfeeding.    General: No apparent distress alert and oriented x3 mood and affect normal, dressed appropriately.  HEENT: Pupils equal, extraocular movements intact but with testing patient does have significant saccadic movements especially with right lateral gaze patient does have difficulty with also finding sounds Respiratory: Patient's speak in full sentences and does not appear short of breath  Cardiovascular: No lower extremity edema, non tender, no erythema  Skin: Warm dry intact with no signs of infection or rash on extremities or on axial skeleton.  Abdomen: Soft nontender  Neuro: Cranial nerves II through XII are intact, neurovascularly intact in all extremities with 2+ DTRs and 2+ pulses.  Lymph: No lymphadenopathy of posterior or anterior cervical chain or axillae bilaterally.  Gait normal with Davis balance and coordination.  When patient does close eyes have significant difficulty with balance but was able to stand up. MSK:  Non tender with full range of motion and Pate stability and symmetric strength and tone of shoulders, elbows, wrist, hip, knee and ankles bilaterally.  Great strength with Valdez deep  tendon reflexes.    Impression and Recommendations:     This case required medical decision making of moderate complexity. The above documentation has been reviewed and is accurate and complete Anna Pulley, DO       Note: This dictation was prepared with Dragon dictation along with smaller phrase technology. Any transcriptional errors that result from this process are unintentional.

## 2019-11-29 NOTE — Patient Instructions (Addendum)
[place] to see you Happy Holidays! Will be in touch after the MRIs

## 2019-12-28 ENCOUNTER — Ambulatory Visit
Admission: RE | Admit: 2019-12-28 | Discharge: 2019-12-28 | Disposition: A | Discharge status: Home / Self Care | Payer: BC Managed Care – PPO | Source: Ambulatory Visit | Attending: Family Medicine | Admitting: Family Medicine

## 2019-12-28 ENCOUNTER — Other Ambulatory Visit: Payer: Self-pay

## 2019-12-28 DIAGNOSIS — R42 Dizziness and giddiness: Secondary | ICD-10-CM

## 2019-12-28 MED ORDER — GADOBENATE DIMEGLUMINE 529 MG/ML IV SOLN
13.0000 mL | Freq: Once | INTRAVENOUS | Status: AC | PRN
  Administered 2019-12-28: 13 mL via INTRAVENOUS

## 2019-12-29 ENCOUNTER — Encounter: Payer: Self-pay | Admitting: Family Medicine

## 2020-02-08 ENCOUNTER — Ambulatory Visit: Payer: BC Managed Care – PPO | Attending: Internal Medicine

## 2020-02-08 DIAGNOSIS — Z20822 Contact with and (suspected) exposure to covid-19: Secondary | ICD-10-CM

## 2020-02-09 LAB — NOVEL CORONAVIRUS, NAA: SARS-CoV-2, NAA: DETECTED — AB

## 2020-12-17 LAB — HM PAP SMEAR

## 2020-12-17 LAB — RESULTS CONSOLE HPV: CHL HPV: NEGATIVE

## 2021-08-06 ENCOUNTER — Ambulatory Visit (INDEPENDENT_AMBULATORY_CARE_PROVIDER_SITE_OTHER): Payer: BC Managed Care – PPO

## 2021-08-06 ENCOUNTER — Other Ambulatory Visit: Payer: Self-pay

## 2021-08-06 ENCOUNTER — Ambulatory Visit: Payer: BC Managed Care – PPO | Admitting: Family Medicine

## 2021-08-06 ENCOUNTER — Encounter: Payer: Self-pay | Admitting: Family Medicine

## 2021-08-06 VITALS — BP 114/68 | HR 115 | Ht 64.0 in | Wt 141.8 lb

## 2021-08-06 DIAGNOSIS — S92355A Nondisplaced fracture of fifth metatarsal bone, left foot, initial encounter for closed fracture: Secondary | ICD-10-CM | POA: Insufficient documentation

## 2021-08-06 DIAGNOSIS — M79672 Pain in left foot: Secondary | ICD-10-CM

## 2021-08-06 HISTORY — DX: Nondisplaced fracture of fifth metatarsal bone, left foot, initial encounter for closed fracture: S92.355A

## 2021-08-06 NOTE — Assessment & Plan Note (Signed)
Small nondisplaced fracture noted.  He does have significant swelling.  Patient did have unfortunately not a significant amount of pressure that did cause this potential fracture.  X-rays are fairly unremarkable.  Fracture only seen on the ultrasound.  Discussed rigid boot or rigid shoes.  Discussed topical anti-inflammatories, icing as well as avoiding being barefoot.  Discussed vitamin D supplementation.  Follow-up again in 4 to 6 weeks

## 2021-08-06 NOTE — Progress Notes (Signed)
[name] [address] [place] [address] Phone: [phone] Subjective:   [name], am serving as a scribe for Dr. Antoine Primas.   This visit occurred during the SARS-CoV-2 public health emergency.  Safety protocols were in place, including screening questions prior to the visit, additional usage of staff PPE, and extensive cleaning of exam room while observing appropriate contact time as indicated for disinfecting solutions.   I'm seeing this patient by the request  of:  Patient, No Pcp Per (Inactive)  CC: Left foot pain  [id number]  Anna Huff is a 40 y.o. female coming in with complaint of L foot injury.  Onset- Last Thursday shooting pain up leg, swelling but has gone down, but hurts to walk, tender to touch Location-base of the 5th  Duration-  Character- now dull, but sometimes standing is sharp pain Aggravating factors- Reliving factors- pain meds help Therapies tried-  Severity-severe enough to cause a limp     Past Medical History:  Diagnosis Date   Hx of varicella    Medical history non-contributory    Past Surgical History:  Procedure Laterality Date   COLONOSCOPY     DILATION AND EVACUATION N/A 05/13/2018   Procedure: DILATATION AND EVACUATION;  Surgeon: Waynard Reeds, MD;  Location: [facility];  Service: Gynecology;  Laterality: N/A;   NO PAST SURGERIES     TONSILLECTOMY     Social History   Socioeconomic History   Marital status: Married    Spouse name: Not on file   Number of children: Not on file   Years of education: Not on file   Highest education level: Not on file  Occupational History   Not on file  Tobacco Use   Smoking status: Never   Smokeless tobacco: Never  Substance and Sexual Activity   Alcohol use: Yes    Comment: occassional   Drug use: No   Sexual activity: Yes  Other Topics Concern   Not on file  Social History Narrative   Not on file   Social Determinants of Health   Financial  Resource Strain: Not on file  Food Insecurity: Not on file  Transportation Needs: Not on file  Physical Activity: Not on file  Stress: Not on file  Social Connections: Not on file   No Known Allergies Family History  Problem Relation Age of Onset   Cancer Maternal Grandmother        lung   Diabetes Paternal Grandmother     Current Outpatient Medications (Endocrine & Metabolic):    NUVARING 0.12-0.015 MG/24HR vaginal ring,       Current Outpatient Medications (Other):    Diclofenac Sodium (PENNSAID) 2 % SOLN, Place 2 g onto the skin 2 (two) times daily.   Vitamin D, Ergocalciferol, (DRISDOL) 1.25 MG (50000 UT) CAPS capsule, TAKE 1 CAPSULE (50,000 UNITS TOTAL) BY MOUTH EVERY 7 (SEVEN) DAYS.   Reviewed prior external information including notes and imaging from  primary care provider As well as notes that were available from care everywhere and other healthcare systems.  Past medical history, social, surgical and family history all reviewed in electronic medical record.  No pertanent information unless stated regarding to the chief complaint.   Review of Systems:  No headache, visual changes, nausea, vomiting, diarrhea, constipation, dizziness, abdominal pain, skin rash, fevers, chills, night sweats, weight loss, swollen lymph nodes,  joint swelling, chest pain, shortness of breath, mood changes. POSITIVE muscle aches, body aches  Objective  Blood  pressure 114/68, pulse (!) 115, height 5\' 4"  (1.626 m), weight 141 lb 12.8 oz (64.3 kg), last menstrual period 07/07/2021, SpO2 99 %, unknown if currently breastfeeding.   General: No apparent distress alert and oriented x3 mood and affect normal, dressed appropriately.  HEENT: Pupils equal, extraocular movements intact  Respiratory: Patient's speak in full sentences and does not appear short of breath  Cardiovascular: No lower extremity edema, non tender, no erythema  Gait antalgic gait noted. MSK: Left foot exam shows patient does  have tenderness to palpation over the base of the fifth metatarsal.  Patient does have swelling noted over the base of the fourth metatarsal.  Mild vesicle formations noted on the anterior lateral ankle.  No sign of infectious etiology though.  Limited musculoskeletal ultrasound of patient's left foot shows there is a cortical irregularity noted of the fifth metatarsal but nondisplaced.  Very mild hypoechoic changes noted.  No irregularity noted of the fourth metatarsal.   Impression and Recommendations:     The above documentation has been reviewed and is accurate and complete 09/06/2021, DO

## 2021-08-06 NOTE — Patient Instructions (Signed)
Post-op Shoe until seen again Vitamin D 2000 IU See me again in 3-4 weeks Use Ice 20 mins 2x a day Avoid being barefoot

## 2021-08-29 NOTE — Progress Notes (Signed)
[facility] [address] [place] [address] Phone: [phone] Subjective:   Anna Huff, am serving as a scribe for Dr. Antoine Primas. This visit occurred during the SARS-CoV-2 public health emergency.  Safety protocols were in place, including screening questions prior to the visit, additional usage of staff PPE, and extensive cleaning of exam room while observing appropriate contact time as indicated for disinfecting solutions.   I'm seeing this patient by the request  of:  Patient, No Pcp Per (Inactive)  CC: Foot pain follow-up  [id number]  08/06/2021 Small nondisplaced fracture noted.  He does have significant swelling.  Patient did have unfortunately not a significant amount of pressure that did cause this potential fracture.  X-rays are fairly unremarkable.  Fracture only seen on the ultrasound.  Discussed rigid boot or rigid shoes.  Discussed topical anti-inflammatories, icing as well as avoiding being barefoot.  Discussed vitamin D supplementation.  Follow-up again in 4 to 6 weeks  Updated 09/01/2021 Anna Huff is a 40 y.o. female coming in with complaint of left foot pain. States pain is getting better. Still has some pain.  Patient states it is very minimal though compared to what it was previously.  As long as she wears the shoe seems to be doing relatively well.     Past Medical History:  Diagnosis Date   Hx of varicella    Medical history non-contributory    Past Surgical History:  Procedure Laterality Date   COLONOSCOPY     DILATION AND EVACUATION N/A 05/13/2018   Procedure: DILATATION AND EVACUATION;  Surgeon: Waynard Reeds, MD;  Location: [facility];  Service: Gynecology;  Laterality: N/A;   NO PAST SURGERIES     TONSILLECTOMY     Social History   Socioeconomic History   Marital status: Married    Spouse name: Not on file   Number of children: Not on file   Years of education: Not on file   Highest education level: Not on  file  Occupational History   Not on file  Tobacco Use   Smoking status: Never   Smokeless tobacco: Never  Substance and Sexual Activity   Alcohol use: Yes    Comment: occassional   Drug use: No   Sexual activity: Yes  Other Topics Concern   Not on file  Social History Narrative   Not on file   Social Determinants of Health   Financial Resource Strain: Not on file  Food Insecurity: Not on file  Transportation Needs: Not on file  Physical Activity: Not on file  Stress: Not on file  Social Connections: Not on file   No Known Allergies Family History  Problem Relation Age of Onset   Cancer Maternal Grandmother        lung   Diabetes Paternal Grandmother     Current Outpatient Medications (Endocrine & Metabolic):    NUVARING 0.12-0.015 MG/24HR vaginal ring,       Current Outpatient Medications (Other):    Diclofenac Sodium (PENNSAID) 2 % SOLN, Place 2 g onto the skin 2 (two) times daily.   Vitamin D, Ergocalciferol, (DRISDOL) 1.25 MG (50000 UT) CAPS capsule, TAKE 1 CAPSULE (50,000 UNITS TOTAL) BY MOUTH EVERY 7 (SEVEN) DAYS.   Reviewed prior external information including notes and imaging from  primary care provider As well as notes that were available from care everywhere and other healthcare systems.  Past medical history, social, surgical and family history all reviewed in electronic medical record.  No pertanent information unless stated regarding to the chief complaint.   Review of Systems:  No headache, visual changes, nausea, vomiting, diarrhea, constipation, dizziness, abdominal pain, skin rash, fevers, chills, night sweats, weight loss, swollen lymph nodes, body aches, joint swelling, chest pain, shortness of breath, mood changes. POSITIVE muscle aches  Objective  Blood pressure 104/68, pulse 62, height 5\' 4"  (1.626 m), weight 143 lb (64.9 kg), SpO2 95 %, unknown if currently breastfeeding.   General: No apparent distress alert and oriented x3 mood and  affect normal, dressed appropriately.  HEENT: Pupils equal, extraocular movements intact  Respiratory: Patient's speak in full sentences and does not appear short of breath  Cardiovascular: No lower extremity edema, non tender, no erythema  Gait normal with Brueggemann balance and coordination.  MSK: Left foot exam seems to be fairly unremarkable.  Some very mild tenderness to palpation over the fifth metatarsal proximally.  Patient does have a very mild bony prominence but no significant swelling over the bony prominence now.  Lape range of motion of the ankle noted.  Neurovascularly intact distally.  Limited muscular skeletal ultrasound was performed and interpreted by , M   Limited ultrasound of patient's fifth metatarsal shows the patient does have callus formation noted.  Still some very mild hypoechoic changes noted.  Otherwise fairly unremarkable. Impression: Interval improvement    Impression and Recommendations:     The above documentation has been reviewed and is accurate and complete Antoine Primas, DO

## 2021-09-01 ENCOUNTER — Ambulatory Visit: Payer: Self-pay

## 2021-09-01 ENCOUNTER — Ambulatory Visit: Payer: BC Managed Care – PPO | Admitting: Family Medicine

## 2021-09-01 ENCOUNTER — Other Ambulatory Visit: Payer: Self-pay

## 2021-09-01 ENCOUNTER — Encounter: Payer: Self-pay | Admitting: Family Medicine

## 2021-09-01 VITALS — BP 104/68 | HR 62 | Ht 64.0 in | Wt 143.0 lb

## 2021-09-01 DIAGNOSIS — M79672 Pain in left foot: Secondary | ICD-10-CM | POA: Diagnosis not present

## 2021-09-01 DIAGNOSIS — S92355A Nondisplaced fracture of fifth metatarsal bone, left foot, initial encounter for closed fracture: Secondary | ICD-10-CM

## 2021-09-01 NOTE — Assessment & Plan Note (Signed)
Appears to be doing significantly better at this time.  Not having any significant discomfort.  Discussed continuing the vitamin D and will certainly transition over the more rigid shoes.  Increase activity slowly and follow-up with me again in 6 weeks if not completely healed

## 2021-09-01 NOTE — Patient Instructions (Signed)
Tell Adam to get cooking Looks like its making [place] progress Continue Vitamin D Transition into ridged sole shoes See you again in 5-6 weeks if not perfect, other wise Alanis luck with foot modeling pitch

## 2021-10-09 ENCOUNTER — Ambulatory Visit: Payer: BC Managed Care – PPO | Admitting: Family Medicine

## 2022-05-12 IMAGING — DX DG FOOT COMPLETE 3+V*L*
3 series · 3 of 3 positions shown · non-contrast
Comparison: None.

CLINICAL DATA: Left foot pain

EXAM:
LEFT FOOT - COMPLETE 3+ VIEW

[foot ap]
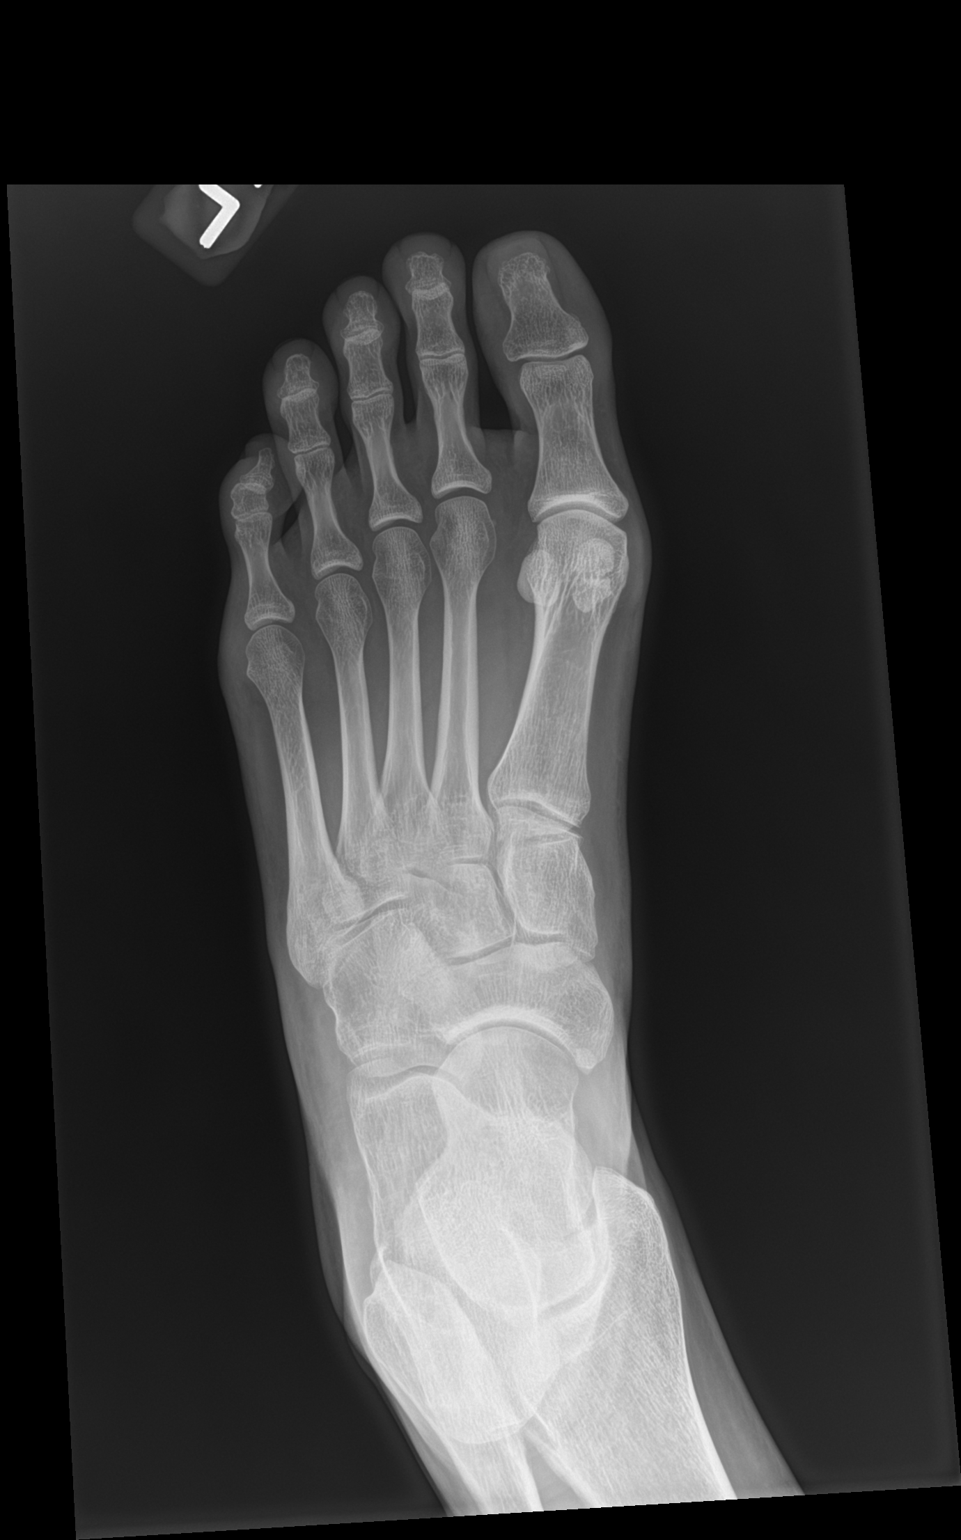

[foot obl]
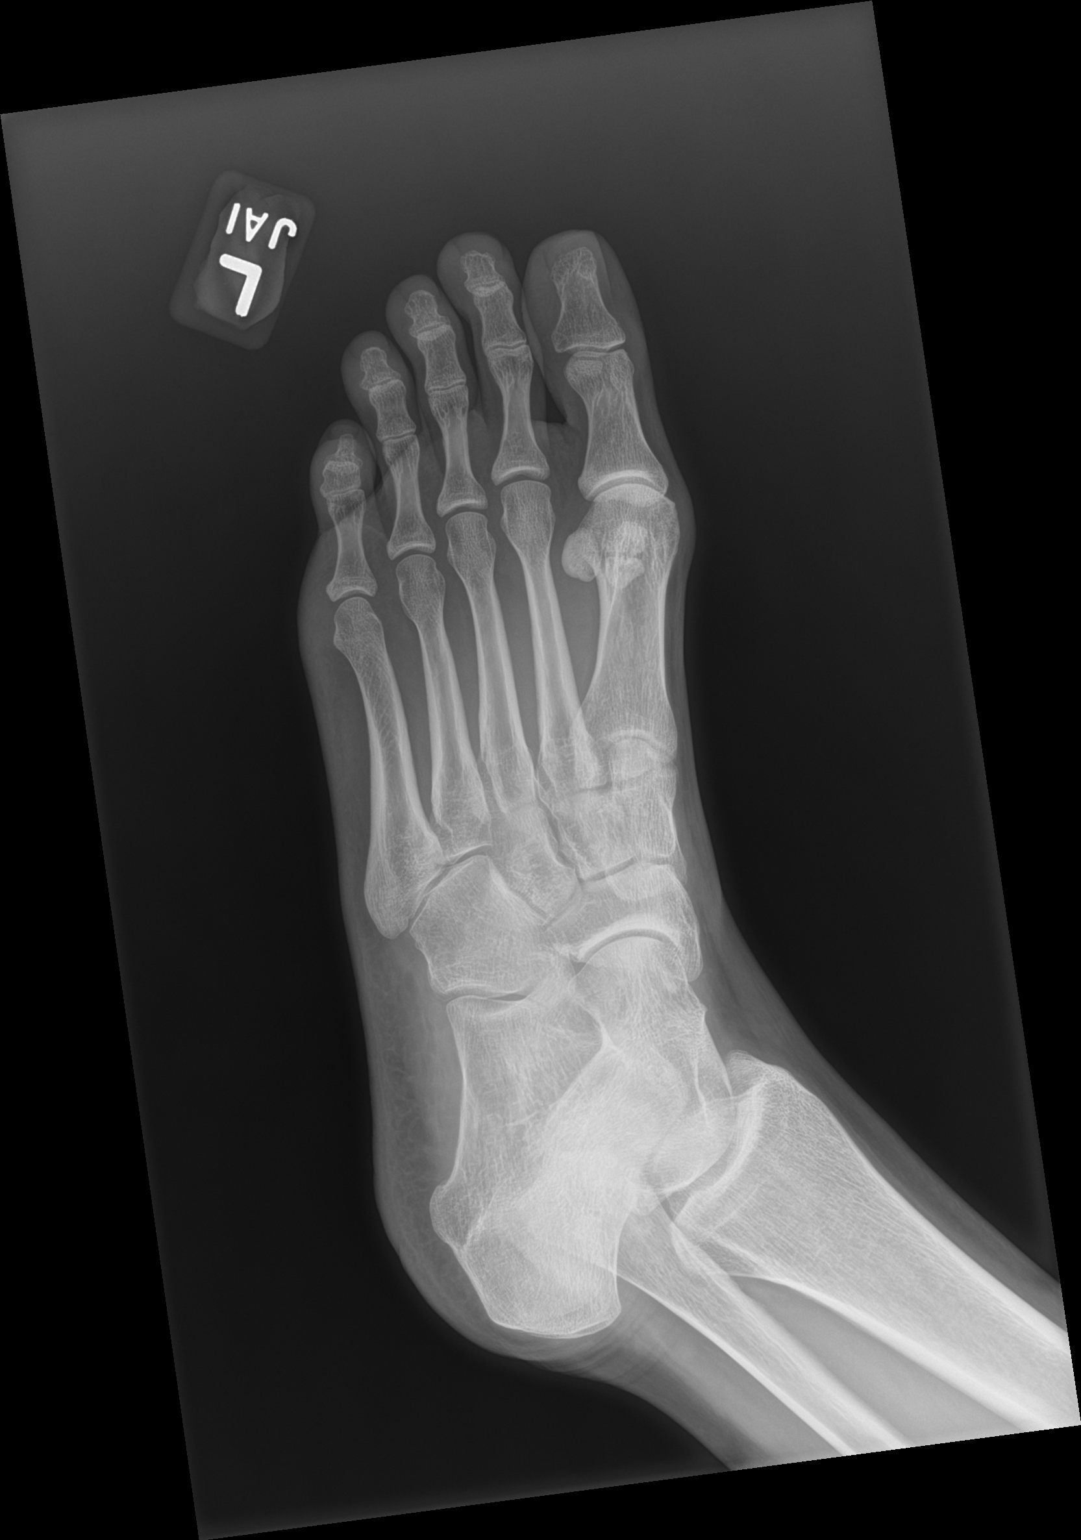

[foot lat]
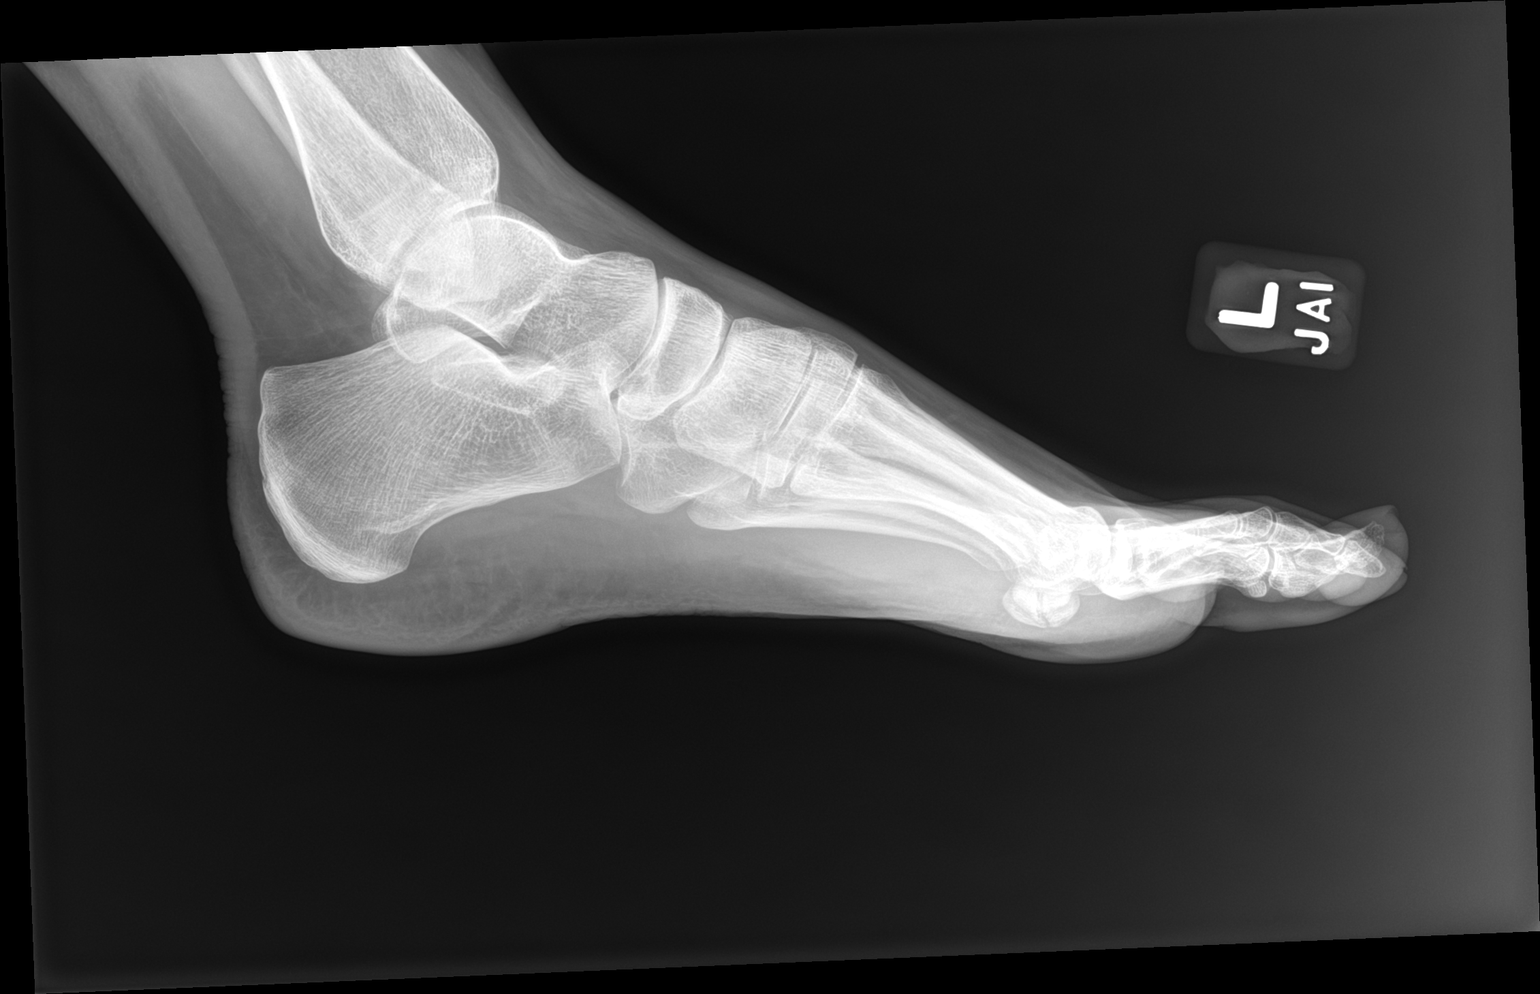

[3 of 3 positions shown; findings below may reference images not displayed]

FINDINGS: There is no acute fracture or dislocation. Bony alignment is normal.
The joint spaces are preserved. The Lisfranc and Chopart joints are
intact.

The soft tissues are unremarkable.
IMPRESSION: Normal foot radiographs.

## 2023-07-07 ENCOUNTER — Ambulatory Visit: Payer: BC Managed Care – PPO | Admitting: Internal Medicine

## 2023-07-07 ENCOUNTER — Encounter (INDEPENDENT_AMBULATORY_CARE_PROVIDER_SITE_OTHER): Payer: Self-pay

## 2023-07-07 ENCOUNTER — Encounter: Payer: Self-pay | Admitting: Internal Medicine

## 2023-07-07 VITALS — BP 102/64 | HR 60 | Temp 98.4°F | Ht 64.0 in | Wt 152.4 lb

## 2023-07-07 DIAGNOSIS — H8193 Unspecified disorder of vestibular function, bilateral: Secondary | ICD-10-CM | POA: Diagnosis not present

## 2023-07-07 DIAGNOSIS — E559 Vitamin D deficiency, unspecified: Secondary | ICD-10-CM

## 2023-07-07 NOTE — Progress Notes (Signed)
[facility] [address] [place] [address] Dept: [phone] Dept Fax: [phone]  New Patient Office Visit  Subjective:   Anna Huff 21-Nov-1981 07/07/2023  Chief Complaint  Patient presents with   Establish Care    HPI: Fatimata Murin presents today to establish care at [facility] at [facility]. Introduced to Publishing rights manager role and practice setting.  All questions answered.  Concerns: See below   Discussed the use of AI scribe software for clinical note transcription with the patient, who gave verbal consent to proceed.  History of Present Illness   The patient, with a history of vestibular dysfunction IBS, vitamin d deficiency, and anemia presents to establish care. She completed vestibular therapy in the past and is coping well with the condition. She is currently taking over-the-counter vitamin D a few times a week, but is unsure of the exact dosage. She has no known allergies to medications and no history of high blood pressure, diabetes, or cholesterol. She has had her tonsils removed in the past and has a family history of lung cancer on her mother's side. She is not currently on any other daily medications, but tries to take vitamins regularly. Her last physical was approximately a year and a half ago. 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  No concerns or questions today.       The following portions of the patient's history were reviewed and updated as appropriate: past medical history, past surgical history, family history, social history, allergies, medications, and problem list.   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/18/2019   Pes anserine bursitis  01/18/2019   Family history of autoimmune disorder 01/18/2019   Benign paroxysmal positional vertigo 04/21/2017   Anemia 04/15/2016   IBS (irritable bowel syndrome) 04/14/2016   Normal labor 02/08/2015   Spontaneous vaginal delivery 02/08/2015   Past Medical History:  Diagnosis Date   Hx of varicella    Medical history non-contributory    Past Surgical History:  Procedure Laterality Date   COLONOSCOPY     DILATION AND EVACUATION N/A 05/13/2018   Procedure: DILATATION AND EVACUATION;  Surgeon: Waynard Reeds, MD;  Location: [facility];  Service: Gynecology;  Laterality: N/A;   NO PAST SURGERIES     TONSILLECTOMY     Family History  Problem Relation Age of Onset   Cancer Maternal Grandmother        lung   Diabetes Paternal Grandmother    Outpatient Medications Prior to Visit  Medication Sig Dispense Refill   Vitamin D, Ergocalciferol, (DRISDOL) 1.25 MG (50000 UT) CAPS capsule TAKE 1 CAPSULE (50,000 UNITS TOTAL) BY MOUTH EVERY 7 (SEVEN) DAYS. 12 capsule 0   Diclofenac Sodium (PENNSAID) 2 % SOLN Place 2 g onto the skin 2 (two) times daily. (Patient not taking: Reported on 07/07/2023) 112 g 3   NUVARING 0.12-0.015 MG/24HR vaginal ring  (Patient not taking: Reported on 07/07/2023)     No facility-administered medications prior to visit.   No Known Allergies  ROS: A complete ROS was performed with pertinent positives/negatives noted in the HPI. The remainder of the ROS are negative.   Objective:   Today's Vitals   07/07/23 1307  BP: 102/64  Pulse: 60  Temp: 98.4  F (36.9 C)  TempSrc: Temporal  SpO2: 98%  Weight: 152 lb 6.4 oz (69.1 kg)  Height: 5\' 4"  (1.626 m)    GENERAL: Well-appearing, in NAD. Well nourished.  SKIN: Pink, warm and dry.  NECK: Trachea midline. Full ROM w/o pain or tenderness. No lymphadenopathy.  RESPIRATORY: Chest wall symmetrical. Respirations even and non-labored. Breath sounds clear to auscultation bilaterally.  CARDIAC: S1, S2 present, regular rate and  rhythm. Peripheral pulses 2+ bilaterally.  EXTREMITIES: Without clubbing, cyanosis, or edema.  NEUROLOGIC: Steady, even gait.  PSYCH/MENTAL STATUS: Alert, oriented x 3. Cooperative, appropriate mood and affect.   Health Maintenance Due  Topic Date Due   Hepatitis C Screening  Never done   PAP SMEAR-Modifier  Never done    No results found for any visits on 07/07/23.  Assessment & Plan:  Assessment and Plan 1. Vitamin D deficiency - continue OTC vitamin D supplement   2. Vestibular dysfunction of both ears - patient is coping with condition at this time. Continue to monitor    Return in about 2 weeks (around 07/21/2023) for Fasting Annual Physical Exam.   Of note, portions of this note may have been created with voice recognition software Physicist, medical). While this note has been edited for accuracy, occasional wrong-word or 'sound-a-like' substitutions may have occurred due to the inherent limitations of voice recognition software.  Salvatore Decent, FNP

## 2023-07-22 NOTE — Progress Notes (Signed)
Subjective:   Anna Huff 11-Aug-1981  07/23/2023   CC: Chief Complaint  Patient presents with   Annual Exam    HPI: Anna Huff is a 42 y.o. female who presents for a routine health maintenance exam.  Labs collected at time of visit.    HEALTH SCREENINGS: - Pap smear:  done with OBGYN per patient, pap in 10/2022 - normal - Mammogram (40+): Not applicable  - Colonoscopy (45+): Not applicable  - Bone Density (65+): Not applicable  - Lung CA screening with low-dose CT:  Not applicable Adults age 75-80 who are current cigarette smokers or quit within the last 15 years. Must have 20 pack year history.   Depression and Anxiety Screen done today and results listed below:     07/23/2023   10:18 AM 07/07/2023    1:10 PM  Depression screen PHQ 2/9  Decreased Interest 0 0  Down, Depressed, Hopeless 0 0  PHQ - 2 Score 0 0  Altered sleeping  2  Tired, decreased energy  2  Change in appetite  0  Feeling bad or failure about yourself   0  Trouble concentrating  0  Moving slowly or fidgety/restless  0  Suicidal thoughts  0  PHQ-9 Score  4  Difficult doing work/chores  Not difficult at all      07/07/2023    1:11 PM  GAD 7 : Generalized Anxiety Score  Nervous, Anxious, on Edge 0  Control/stop worrying 0  Worry too much - different things 0  Trouble relaxing 0  Restless 0  Easily annoyed or irritable 0  Afraid - awful might happen 0  Total GAD 7 Score 0  Anxiety Difficulty Not difficult at all    IMMUNIZATIONS: - Tdap: Tetanus vaccination status reviewed: last tetanus booster within 10 years. - Influenza: Postponed to flu season - Pneumovax: Not applicable - Prevnar 20: Not applicable - Zostavax (50+): Not applicable   Past medical history, surgical history, medications, allergies, family history and social history reviewed with patient today and changes made to appropriate areas of the chart.   Past Medical History:  Diagnosis Date   Hx of varicella    Medical  history non-contributory     Past Surgical History:  Procedure Laterality Date   COLONOSCOPY     DILATION AND EVACUATION N/A 05/13/2018   Procedure: DILATATION AND EVACUATION;  Surgeon: Waynard Reeds, MD;  Location: [facility];  Service: Gynecology;  Laterality: N/A;   NO PAST SURGERIES     TONSILLECTOMY      Current Outpatient Medications on File Prior to Visit  Medication Sig   Vitamin D, Ergocalciferol, (DRISDOL) 1.25 MG (50000 UT) CAPS capsule TAKE 1 CAPSULE (50,000 UNITS TOTAL) BY MOUTH EVERY 7 (SEVEN) DAYS.   No current facility-administered medications on file prior to visit.    No Known Allergies   Social History   Socioeconomic History   Marital status: Married    Spouse name: Not on file   Number of children: Not on file   Years of education: Not on file   Highest education level: Not on file  Occupational History   Not on file  Tobacco Use   Smoking status: Never   Smokeless tobacco: Never  Substance and Sexual Activity   Alcohol use: Yes    Comment: occassional   Drug use: No   Sexual activity: Yes  Other Topics Concern   Not on file  Social History Narrative   Not on file   Social  Determinants of Health   Financial Resource Strain: Not on file  Food Insecurity: Not on file  Transportation Needs: Not on file  Physical Activity: Not on file  Stress: Not on file  Social Connections: Not on file  Intimate Partner Violence: Not on file   Social History   Tobacco Use  Smoking Status Never  Smokeless Tobacco Never   Social History   Substance and Sexual Activity  Alcohol Use Yes   Comment: occassional    Family History  Problem Relation Age of Onset   Cancer Maternal Grandmother        lung   Diabetes Paternal Grandmother      ROS: Denies fever, fatigue, unexplained weight loss/gain, hearing or vision changes, cardiac or respiratory complaints. Denies neurological deficits, musculoskeletal complaints, gastrointestinal or genitourinary  complaints, mental health complaints, and skin changes.   Objective:   Today's Vitals   07/23/23 1017  BP: 114/72  Pulse: 60  Temp: 98.2 F (36.8 C)  TempSrc: Temporal  SpO2: 98%  Weight: 151 lb 12.8 oz (68.9 kg)  Height: 5\' 4"  (1.626 m)    GENERAL APPEARANCE: Well-appearing, in NAD. Well nourished.  SKIN: Pink, warm and dry. Turgor normal. No rash, lesion, ulceration, or ecchymoses. Hair evenly distributed.  HEENT: HEAD: Normocephalic.  EYES: PERRLA. EOMI. Lids intact w/o defect. Sclera white, Conjunctiva pink w/o exudate.  EARS: External ear w/o redness, swelling, masses or lesions. EAC clear. TM's intact, translucent w/o bulging, appropriate landmarks visualized. Appropriate acuity to conversational tones.  NOSE: Septum midline w/o deformity. Nares patent, mucosa pink and non-inflamed w/o drainage. No sinus tenderness.  THROAT: Uvula midline. Oropharynx clear. Tonsils absent. Oral mucosa pink and moist.  NECK: Supple, Trachea midline. Full ROM w/o pain or tenderness. No lymphadenopathy. Thyroid non-tender w/o enlargement or palpable masses.  BREASTS: Breasts pendulous, symmetrical, and w/o palpable masses. Nipples everted and w/o discharge. No rash or skin retraction. No axillary or supraclavicular lymphadenopathy.  RESPIRATORY: Chest wall symmetrical w/o masses. Respirations even and non-labored. Breath sounds clear to auscultation bilaterally. No wheezes, rales, rhonchi, or crackles. CARDIAC: S1, S2 present, regular rate and rhythm. No gallops, murmurs, rubs, or clicks. Capillary refill <2 seconds. Peripheral pulses 2+ bilaterally. GI: Abdomen soft w/o distention. Normoactive bowel sounds. No palpable masses or tenderness. No guarding or rebound tenderness. Liver and spleen w/o tenderness or enlargement. No CVA tenderness.  GU: deferred exam  MSK: Muscle tone and strength appropriate for age, w/o atrophy or abnormal movement.  EXTREMITIES: Active ROM intact, w/o tenderness,  crepitus, or contracture. No obvious joint deformities or effusions. No clubbing, edema, or cyanosis.  NEUROLOGIC: CN's II-XII intact. Motor strength symmetrical with no obvious weakness. No sensory deficits. Steady, even gait.  PSYCH/MENTAL STATUS: Alert, oriented x 3. Cooperative, appropriate mood and affect.   Assessment & Plan:  1. Encounter for general adult medical examination without abnormal findings - CBC with Differential/Platelet - Comprehensive metabolic panel - TSH - Lipid panel  Orders Placed This Encounter  Procedures   CBC with Differential/Platelet   Comprehensive metabolic panel   TSH   Lipid panel    PATIENT COUNSELING:  - healthy well-balanced diet.  - regular exercise   NEXT PREVENTATIVE PHYSICAL DUE IN 1 YEAR.  Return in about 1 year (around 07/22/2024) for Fasting Annual Physical Exam.  Salvatore Decent, FNP

## 2023-07-23 ENCOUNTER — Ambulatory Visit (INDEPENDENT_AMBULATORY_CARE_PROVIDER_SITE_OTHER): Payer: BC Managed Care – PPO | Admitting: Internal Medicine

## 2023-07-23 ENCOUNTER — Encounter: Payer: Self-pay | Admitting: Internal Medicine

## 2023-07-23 VITALS — BP 114/72 | HR 60 | Temp 98.2°F | Ht 64.0 in | Wt 151.8 lb

## 2023-07-23 DIAGNOSIS — Z0000 Encounter for general adult medical examination without abnormal findings: Secondary | ICD-10-CM

## 2023-07-23 DIAGNOSIS — Z124 Encounter for screening for malignant neoplasm of cervix: Secondary | ICD-10-CM

## 2023-07-23 LAB — LIPID PANEL
Cholesterol: 165 mg/dL (ref 0–200)
HDL: 62.8 mg/dL (ref >39.00)
LDL Cholesterol: 83 mg/dL (ref 0–99)
NonHDL: 101.83
Total CHOL/HDL Ratio: 3
Triglycerides: 94 mg/dL (ref 0.0–149.0)
VLDL: 18.8 mg/dL (ref 0.0–40.0)

## 2023-07-23 LAB — TSH: TSH: 1.25 u[IU]/mL (ref 0.35–5.50)

## 2023-07-23 LAB — COMPREHENSIVE METABOLIC PANEL
ALT: 8 U/L (ref 0–35)
AST: 16 U/L (ref 0–37)
Albumin: 4.7 g/dL (ref 3.5–5.2)
Alkaline Phosphatase: 75 U/L (ref 39–117)
BUN: 10 mg/dL (ref 6–23)
CO2: 26 mEq/L (ref 19–32)
Calcium: 9.1 mg/dL (ref 8.4–10.5)
Chloride: 101 mEq/L (ref 96–112)
Creatinine, Ser: 0.73 mg/dL (ref 0.40–1.20)
GFR: 103.37 mL/min (ref >60.00)
Glucose, Bld: 86 mg/dL (ref 70–99)
Potassium: 3.8 mEq/L (ref 3.5–5.1)
Sodium: 135 mEq/L (ref 135–145)
Total Bilirubin: 0.6 mg/dL (ref 0.2–1.2)
Total Protein: 7.1 g/dL (ref 6.0–8.3)

## 2023-07-23 LAB — CBC WITH DIFFERENTIAL/PLATELET
Basophils Absolute: 0 10*3/uL (ref 0.0–0.1)
Basophils Relative: 0.7 % (ref 0.0–3.0)
Eosinophils Absolute: 0.1 10*3/uL (ref 0.0–0.7)
Eosinophils Relative: 2.5 % (ref 0.0–5.0)
HCT: 41.2 % (ref 36.0–46.0)
Hemoglobin: 12.9 g/dL (ref 12.0–15.0)
Lymphocytes Relative: 24.1 % (ref 12.0–46.0)
Lymphs Abs: 1.4 10*3/uL (ref 0.7–4.0)
MCHC: 31.3 g/dL (ref 30.0–36.0)
MCV: 91.3 fl (ref 78.0–100.0)
Monocytes Absolute: 0.5 10*3/uL (ref 0.1–1.0)
Monocytes Relative: 9.2 % (ref 3.0–12.0)
Neutro Abs: 3.6 10*3/uL (ref 1.4–7.7)
Neutrophils Relative %: 63.5 % (ref 43.0–77.0)
Platelets: 240 10*3/uL (ref 150.0–400.0)
RBC: 4.52 Mil/uL (ref 3.87–5.11)
RDW: 15.4 % (ref 11.5–15.5)
WBC: 5.7 10*3/uL (ref 4.0–10.5)

## 2023-07-29 ENCOUNTER — Ambulatory Visit: Payer: BC Managed Care – PPO | Admitting: Family Medicine

## 2023-07-29 ENCOUNTER — Encounter: Payer: Self-pay | Admitting: Family Medicine

## 2023-07-29 VITALS — BP 112/72 | HR 79 | Temp 97.6°F | Wt 154.8 lb

## 2023-07-29 DIAGNOSIS — W57XXXA Bitten or stung by nonvenomous insect and other nonvenomous arthropods, initial encounter: Secondary | ICD-10-CM | POA: Diagnosis not present

## 2023-07-29 DIAGNOSIS — L03116 Cellulitis of left lower limb: Secondary | ICD-10-CM | POA: Diagnosis not present

## 2023-07-29 DIAGNOSIS — S90862A Insect bite (nonvenomous), left foot, initial encounter: Secondary | ICD-10-CM

## 2023-07-29 MED ORDER — CEPHALEXIN 500 MG PO CAPS: 500.0000 mg | ORAL_CAPSULE | Freq: Two times a day (BID) | ORAL | 0 refills | Status: AC

## 2023-07-29 MED ORDER — EPINEPHRINE 0.3 MG/0.3ML IJ SOAJ: 0.3000 mg | INTRAMUSCULAR | 0 refills | Status: AC | PRN

## 2023-07-29 NOTE — Progress Notes (Signed)
Assessment/Plan:   Problem List Items Addressed This Visit       Other   Cellulitis of left lower extremity - Primary    The patient presents with severe swelling, redness, and pustulation at insect bite sites on the foot, indicating a potential cellulitis with an allergic component.  Plan:  Initiate treatment with Cephalexin 500 mg, twice a day for 7 days. Continue Benadryl as needed for allergic symptom relief. Ibuprofen or Tylenol as needed for pain and discomfort. Monitor closely for changes: Increased swelling, signs of systemic infection or allergic reaction (e.g., swelling of lips/throat). Advise patient to return if no improvement or worsening of symptoms within 7 days. Discuss EpiPen prescription for emergency use in case of severe allergic reaction and refer patient to allergy specialist for further evaluation of insect-allergy.      Relevant Medications   cephALEXin (KEFLEX) 500 MG capsule   EPINEPHrine (EPIPEN 2-PAK) 0.3 mg/0.3 mL IJ SOAJ injection   Other Relevant Orders   Ambulatory referral to Allergy   Other Visit Diagnoses     Insect bite of left foot, initial encounter       Relevant Medications   cephALEXin (KEFLEX) 500 MG capsule   EPINEPHrine (EPIPEN 2-PAK) 0.3 mg/0.3 mL IJ SOAJ injection   Other Relevant Orders   Ambulatory referral to Allergy       There are no discontinued medications.  No follow-ups on file.    Subjective:   Encounter date: 07/29/2023  Anna Huff is a 42 y.o. female who has Normal labor; Spontaneous vaginal delivery; Benign paroxysmal positional vertigo; Anemia; IBS (irritable bowel syndrome); Chondrocalcinosis; Pes anserine bursitis; Family history of autoimmune disorder; Vertigo; Nondisplaced fracture of fifth metatarsal bone, left foot, initial encounter for closed fracture; and Cellulitis of left lower extremity on their problem list..   She  has a past medical history of varicella and Medical history  non-contributory..   Chief Complaint: Insect bites resulting in significant swelling and discomfort.  History of Present Illness:  Insect Bites. The patient presented with significant swelling of left the foot following exposure to ants. The exposure occurred yesterday around 12:30 PM. Although she commonly experiences swelling with insect bites, the current presentation is more severe than usual, with the swelling spreading and restricting toe movement. There are no other known allergies apart from insects.  She described the bites as small, black ant bites, now presenting with pustulation with bites on her toes. There is no known history of bee stings, though a family history of bee allergies exists.  Patient also endorses redness and increased warmth at the site of the bite.  Review of Systems  Constitutional:  Negative for chills and fever.  HENT:         No lip swelling or difficulty swallowing  Respiratory:  Negative for shortness of breath.   Cardiovascular:  Positive for leg swelling.  Gastrointestinal:  Negative for abdominal pain.  Musculoskeletal:  Positive for joint pain.  All other systems reviewed and are negative.   Past Surgical History:  Procedure Laterality Date   COLONOSCOPY     DILATION AND EVACUATION N/A 05/13/2018   Procedure: DILATATION AND EVACUATION;  Surgeon: Waynard Reeds, MD;  Location: [facility];  Service: Gynecology;  Laterality: N/A;   NO PAST SURGERIES     TONSILLECTOMY      Outpatient Medications Prior to Visit  Medication Sig Dispense Refill   Vitamin D, Ergocalciferol, (DRISDOL) 1.25 MG (50000 UT) CAPS capsule TAKE 1 CAPSULE (50,000 UNITS TOTAL)  BY MOUTH EVERY 7 (SEVEN) DAYS. 12 capsule 0   No facility-administered medications prior to visit.    Family History  Problem Relation Age of Onset   Cancer Maternal Grandmother        lung   Diabetes Paternal Grandmother     Social History   Socioeconomic History   Marital status: Married    Spouse  name: Not on file   Number of children: Not on file   Years of education: Not on file   Highest education level: Not on file  Occupational History   Not on file  Tobacco Use   Smoking status: Never   Smokeless tobacco: Never  Substance and Sexual Activity   Alcohol use: Yes    Comment: occassional   Drug use: No   Sexual activity: Yes  Other Topics Concern   Not on file  Social History Narrative   Not on file   Social Determinants of Health   Financial Resource Strain: Not on file  Food Insecurity: Not on file  Transportation Needs: Not on file  Physical Activity: Not on file  Stress: Not on file  Social Connections: Not on file  Intimate Partner Violence: Not on file                                                                                                  Objective:  Physical Exam: BP 112/72 (BP Location: Left Arm, Patient Position: Sitting, Cuff Size: Large)   Pulse 79   Temp 97.6 F (36.4 C) (Temporal)   Wt 154 lb 12.8 oz (70.2 kg)   LMP 07/05/2023   SpO2 99%   BMI 26.57 kg/m     Physical Exam Constitutional:      General: She is not in acute distress.    Appearance: Normal appearance. She is not ill-appearing or toxic-appearing.  HENT:     Head: Normocephalic and atraumatic.     Nose: Nose normal. No congestion.  Eyes:     General: No scleral icterus.    Extraocular Movements: Extraocular movements intact.  Cardiovascular:     Rate and Rhythm: Normal rate and regular rhythm.     Pulses: Normal pulses.     Heart sounds: Normal heart sounds.  Pulmonary:     Effort: Pulmonary effort is normal. No respiratory distress.     Breath sounds: Normal breath sounds.  Abdominal:     General: Abdomen is flat. Bowel sounds are normal.     Palpations: Abdomen is soft.  Musculoskeletal:     Left foot: Decreased range of motion.  Feet:     Left foot:     Skin integrity: Blister, erythema and warmth present.     Comments: Left foot  swelling Lymphadenopathy:     Cervical: No cervical adenopathy.  Skin:    General: Skin is warm and dry.     Findings: No rash.  Neurological:     General: No focal deficit present.     Mental Status: She is alert and oriented to person, place, and time. Mental status is at baseline.  Psychiatric:  Mood and Affect: Mood normal.        Behavior: Behavior normal.        Thought Content: Thought content normal.        Judgment: Judgment normal.     No results found.     Recent Results (from the past 2160 hour(s))  CBC with Differential/Platelet     Status: None   Collection Time: 07/23/23 11:16 AM  Result Value Ref Range   WBC 5.7 4.0 - 10.5 K/uL   RBC 4.52 3.87 - 5.11 Mil/uL   Hemoglobin 12.9 12.0 - 15.0 g/dL   HCT 16.1 09.6 - 04.5 %   MCV 91.3 78.0 - 100.0 fl   MCHC 31.3 30.0 - 36.0 g/dL   RDW 40.9 81.1 - 91.4 %   Platelets 240.0 150.0 - 400.0 K/uL   Neutrophils Relative % 63.5 43.0 - 77.0 %   Lymphocytes Relative 24.1 12.0 - 46.0 %   Monocytes Relative 9.2 3.0 - 12.0 %   Eosinophils Relative 2.5 0.0 - 5.0 %   Basophils Relative 0.7 0.0 - 3.0 %   Neutro Abs 3.6 1.4 - 7.7 K/uL   Lymphs Abs 1.4 0.7 - 4.0 K/uL   Monocytes Absolute 0.5 0.1 - 1.0 K/uL   Eosinophils Absolute 0.1 0.0 - 0.7 K/uL   Basophils Absolute 0.0 0.0 - 0.1 K/uL  Comprehensive metabolic panel     Status: None   Collection Time: 07/23/23 11:16 AM  Result Value Ref Range   Sodium 135 135 - 145 mEq/L   Potassium 3.8 3.5 - 5.1 mEq/L   Chloride 101 96 - 112 mEq/L   CO2 26 19 - 32 mEq/L   Glucose, Bld 86 70 - 99 mg/dL   BUN 10 6 - 23 mg/dL   Creatinine, Ser 7.82 0.40 - 1.20 mg/dL   Total Bilirubin 0.6 0.2 - 1.2 mg/dL   Alkaline Phosphatase 75 39 - 117 U/L   AST 16 0 - 37 U/L   ALT 8 0 - 35 U/L   Total Protein 7.1 6.0 - 8.3 g/dL   Albumin 4.7 3.5 - 5.2 g/dL   GFR 956.21 >30.86 mL/min    Comment: Calculated using the CKD-EPI Creatinine Equation (2021)   Calcium 9.1 8.4 - 10.5 mg/dL  TSH      Status: None   Collection Time: 07/23/23 11:16 AM  Result Value Ref Range   TSH 1.25 0.35 - 5.50 uIU/mL  Lipid panel     Status: None   Collection Time: 07/23/23 11:16 AM  Result Value Ref Range   Cholesterol 165 0 - 200 mg/dL    Comment: ATP III Classification       Desirable:  < 200 mg/dL               Borderline High:  200 - 239 mg/dL          High:  > = 578 mg/dL   Triglycerides 46.9 0.0 - 149.0 mg/dL    Comment: Normal:  <629 mg/dLBorderline High:  150 - 199 mg/dL   HDL 52.84 >13.24 mg/dL   VLDL 40.1 0.0 - 02.7 mg/dL   LDL Cholesterol 83 0 - 99 mg/dL   Total CHOL/HDL Ratio 3     Comment:                Men          Women1/2 Average Risk     3.4          3.3Average Risk  5.0          4.42X Average Risk          9.6          7.13X Average Risk          15.0          11.0                       NonHDL 101.83     Comment: NOTE:  Non-HDL goal should be 30 mg/dL higher than patient's LDL goal (i.e. LDL goal of < 70 mg/dL, would have non-HDL goal of < 100 mg/dL)        Garner Nash, MD, MS

## 2023-08-01 DIAGNOSIS — L03116 Cellulitis of left lower limb: Secondary | ICD-10-CM

## 2023-08-01 HISTORY — DX: Cellulitis of left lower limb: L03.116

## 2023-08-01 NOTE — Assessment & Plan Note (Signed)
The patient presents with severe swelling, redness, and pustulation at insect bite sites on the foot, indicating a potential cellulitis with an allergic component.  Plan:  Initiate treatment with Cephalexin 500 mg, twice a day for 7 days. Continue Benadryl as needed for allergic symptom relief. Ibuprofen or Tylenol as needed for pain and discomfort. Monitor closely for changes: Increased swelling, signs of systemic infection or allergic reaction (e.g., swelling of lips/throat). Advise patient to return if no improvement or worsening of symptoms within 7 days. Discuss EpiPen prescription for emergency use in case of severe allergic reaction and refer patient to allergy specialist for further evaluation of insect-allergy.

## 2024-07-25 ENCOUNTER — Ambulatory Visit (INDEPENDENT_AMBULATORY_CARE_PROVIDER_SITE_OTHER): Payer: BC Managed Care – PPO | Admitting: Internal Medicine

## 2024-07-25 ENCOUNTER — Encounter: Payer: Self-pay | Admitting: Internal Medicine

## 2024-07-25 VITALS — BP 98/68 | HR 64 | Temp 98.5°F | Ht 65.0 in | Wt 149.4 lb

## 2024-07-25 DIAGNOSIS — Z0000 Encounter for general adult medical examination without abnormal findings: Secondary | ICD-10-CM

## 2024-07-25 DIAGNOSIS — Z1231 Encounter for screening mammogram for malignant neoplasm of breast: Secondary | ICD-10-CM

## 2024-07-25 LAB — LIPID PANEL
Cholesterol: 134 mg/dL (ref 0–200)
HDL: 62.8 mg/dL (ref >39.00)
LDL Cholesterol: 61 mg/dL (ref 0–99)
NonHDL: 71.61
Total CHOL/HDL Ratio: 2
Triglycerides: 51 mg/dL (ref 0.0–149.0)
VLDL: 10.2 mg/dL (ref 0.0–40.0)

## 2024-07-25 LAB — CBC WITH DIFFERENTIAL/PLATELET
Basophils Absolute: 0 K/uL (ref 0.0–0.1)
Basophils Relative: 0.7 % (ref 0.0–3.0)
Eosinophils Absolute: 0.3 K/uL (ref 0.0–0.7)
Eosinophils Relative: 5.2 % — ABNORMAL HIGH (ref 0.0–5.0)
HCT: 40.4 % (ref 36.0–46.0)
Hemoglobin: 13.4 g/dL (ref 12.0–15.0)
Lymphocytes Relative: 21.8 % (ref 12.0–46.0)
Lymphs Abs: 1.2 K/uL (ref 0.7–4.0)
MCHC: 33.1 g/dL (ref 30.0–36.0)
MCV: 94.2 fl (ref 78.0–100.0)
Monocytes Absolute: 0.4 K/uL (ref 0.1–1.0)
Monocytes Relative: 7.8 % (ref 3.0–12.0)
Neutro Abs: 3.5 K/uL (ref 1.4–7.7)
Neutrophils Relative %: 64.5 % (ref 43.0–77.0)
Platelets: 204 K/uL (ref 150.0–400.0)
RBC: 4.29 Mil/uL (ref 3.87–5.11)
RDW: 13.3 % (ref 11.5–15.5)
WBC: 5.4 K/uL (ref 4.0–10.5)

## 2024-07-25 LAB — COMPREHENSIVE METABOLIC PANEL WITH GFR
ALT: 11 U/L (ref 0–35)
AST: 16 U/L (ref 0–37)
Albumin: 4.4 g/dL (ref 3.5–5.2)
Alkaline Phosphatase: 61 U/L (ref 39–117)
BUN: 9 mg/dL (ref 6–23)
CO2: 26 meq/L (ref 19–32)
Calcium: 8.5 mg/dL (ref 8.4–10.5)
Chloride: 105 meq/L (ref 96–112)
Creatinine, Ser: 0.74 mg/dL (ref 0.40–1.20)
GFR: 100.98 mL/min (ref >60.00)
Glucose, Bld: 88 mg/dL (ref 70–99)
Potassium: 4 meq/L (ref 3.5–5.1)
Sodium: 139 meq/L (ref 135–145)
Total Bilirubin: 0.6 mg/dL (ref 0.2–1.2)
Total Protein: 6.7 g/dL (ref 6.0–8.3)

## 2024-07-25 LAB — TSH: TSH: 1.86 u[IU]/mL (ref 0.35–5.50)

## 2024-07-25 NOTE — Progress Notes (Signed)
 Subjective:   Anna Huff February 09, 1981  07/25/2024   CC: Chief Complaint  Patient presents with   Annual Exam    Fasting     HPI: Anna Huff is a 43 y.o. female who presents for a routine health maintenance exam.  Labs  collected at time of visit.    HEALTH SCREENINGS: - Pap smear: patient reports UTD by OBGYN - obtaining records - Mammogram (40+): Ordered today  - Colonoscopy (45+): Not applicable  - Bone Density (65+): Not applicable  - Lung CA screening with low-dose CT:  Not applicable Adults age 33-80 who are current cigarette smokers or quit within the last 15 years. Must have 20 pack year history.   IMMUNIZATIONS: - Tdap: Tetanus vaccination status reviewed: last tetanus booster within 10 years. - HPV: discussed HPV vaccination , info provided with d/c summary. Patient will return if she decides to get - Influenza: Postponed to flu season - Prevnar 20: Not applicable - Zostavax (50+): Not applicable   Past medical history, surgical history, medications, allergies, family history and social history reviewed with patient today and changes made to appropriate areas of the chart.   Social History   Socioeconomic History   Marital status: Married    Spouse name: Not on file   Number of children: Not on file   Years of education: Not on file   Highest education level: Doctorate  Occupational History   Not on file  Tobacco Use   Smoking status: Never   Smokeless tobacco: Never  Substance and Sexual Activity   Alcohol use: Yes    Comment: occassional   Drug use: No   Sexual activity: Yes  Other Topics Concern   Not on file  Social History Narrative   Not on file   Social Drivers of Health   Financial Resource Strain: Low Risk  (07/24/2024)   Overall Financial Resource Strain (CARDIA)    Difficulty of Paying Living Expenses: Not hard at all  Food Insecurity: No Food Insecurity (07/24/2024)   Hunger Vital Sign    Worried About Running Out of Food in the  Last Year: Never true    Ran Out of Food in the Last Year: Never true  Transportation Needs: No Transportation Needs (07/24/2024)   PRAPARE - Administrator, Civil Service (Medical): No    Lack of Transportation (Non-Medical): No  Physical Activity: Insufficiently Active (07/24/2024)   Exercise Vital Sign    Days of Exercise per Week: 3 days    Minutes of Exercise per Session: 40 min  Stress: Stress Concern Present (07/24/2024)   [employer] of Occupational Health - Occupational Stress Questionnaire    Feeling of Stress: To some extent  Social Connections: Socially Integrated (07/24/2024)   Social Connection and Isolation Panel    Frequency of Communication with Friends and Family: Twice a week    Frequency of Social Gatherings with Friends and Family: Twice a week    Attends Religious Services: 1 to 4 times per year    Active Member of [employer] or Organizations: Yes    Attends Engineer, structural: More than 4 times per year    Marital Status: Married  Catering manager Violence: Not on file     Past Medical History:  Diagnosis Date   Cellulitis of left lower extremity 08/01/2023   Hx of varicella    Medical history non-contributory    Nondisplaced fracture of fifth metatarsal bone, left foot, initial encounter for closed fracture 08/06/2021  Pes anserine bursitis 01/18/2019    Past Surgical History:  Procedure Laterality Date   COLONOSCOPY     DILATION AND EVACUATION N/A 05/13/2018   Procedure: DILATATION AND EVACUATION;  Surgeon: Okey Leader, MD;  Location: [facility];  Service: Gynecology;  Laterality: N/A;   NO PAST SURGERIES     TONSILLECTOMY      Current Outpatient Medications on File Prior to Visit  Medication Sig   EPINEPHrine  (EPIPEN  2-PAK) 0.3 mg/0.3 mL IJ SOAJ injection Inject 0.3 mg into the muscle as needed for anaphylaxis.   Vitamin D , Ergocalciferol , (DRISDOL ) 1.25 MG (50000 UT) CAPS capsule TAKE 1 CAPSULE (50,000 UNITS TOTAL) BY MOUTH EVERY  7 (SEVEN) DAYS.   No current facility-administered medications on file prior to visit.    No Known Allergies  Family History  Problem Relation Age of Onset   Cancer Maternal Grandmother        lung   Diabetes Paternal Grandmother      ROS: Denies fever, fatigue, unexplained weight loss/gain, hearing or vision changes, cardiac or respiratory complaints. Denies neurological deficits, musculoskeletal complaints, gastrointestinal or genitourinary complaints, mental health complaints, and skin changes.   Objective:   Today's Vitals   07/25/24 0816  BP: 98/68  Pulse: 64  Temp: 98.5 F (36.9 C)  TempSrc: Temporal  SpO2: 97%  Weight: 149 lb 6.4 oz (67.8 kg)  Height: 5' 5 (1.651 m)   GENERAL APPEARANCE: Well-appearing, in NAD. Well nourished.  SKIN: Pink, warm and dry. Turgor normal. No rash, lesion, ulceration, or ecchymoses. Hair evenly distributed.  HEENT: HEAD: Normocephalic.  EYES: PERRLA. EOMI. Lids intact w/o defect. Sclera white, Conjunctiva pink w/o exudate.  EARS: External ear w/o redness, swelling, masses or lesions. EAC clear. TM's intact, translucent w/o bulging, appropriate landmarks visualized. Appropriate acuity to conversational tones.  NOSE: Septum midline w/o deformity. Nares patent, mucosa pink and non-inflamed w/o drainage. THROAT: Uvula midline. Oropharynx clear. Tonsils absent. Oral mucosa pink and moist.  NECK: Supple, Trachea midline. Full ROM w/o pain or tenderness. No lymphadenopathy. Thyroid  non-tender w/o enlargement or palpable masses.  BREASTS: Breasts pendulous, symmetrical, and w/o palpable masses. Nipples everted and w/o discharge. No rash or skin retraction. No axillary or supraclavicular lymphadenopathy.  RESPIRATORY: Chest wall symmetrical w/o masses. Respirations even and non-labored. Breath sounds clear to auscultation bilaterally. No wheezes, rales, rhonchi, or crackles. CARDIAC: S1, S2 present, regular rate and rhythm. No gallops, murmurs,  rubs, or clicks. Capillary refill <2 seconds. Peripheral pulses 2+ bilaterally. GI: Abdomen soft w/o distention. Normoactive bowel sounds. No palpable masses or tenderness. No guarding or rebound tenderness. Liver and spleen w/o tenderness or enlargement. No CVA tenderness.  MSK: Muscle tone and strength appropriate for age, w/o atrophy or abnormal movement.  EXTREMITIES: Active ROM intact, w/o tenderness, crepitus, or contracture. No obvious joint deformities or effusions. No clubbing, edema, or cyanosis.  NEUROLOGIC: CN's II-XII intact. Motor strength symmetrical with no obvious weakness. No sensory deficits. Steady, even gait.  PSYCH/MENTAL STATUS: Alert, oriented x 3. Cooperative, appropriate mood and affect.    Depression and Anxiety Screen done today and results listed below:     07/25/2024    8:24 AM 07/23/2023   10:18 AM 07/07/2023    1:10 PM  Depression screen PHQ 2/9  Decreased Interest 0 0 0  Down, Depressed, Hopeless 0 0 0  PHQ - 2 Score 0 0 0  Altered sleeping 2  2  Tired, decreased energy 1  2  Change in appetite 0  0  Feeling bad or failure about yourself  0  0  Trouble concentrating 0  0  Moving slowly or fidgety/restless 0  0  Suicidal thoughts 0  0  PHQ-9 Score 3  4  Difficult doing work/chores Not difficult at all  Not difficult at all      07/25/2024    8:24 AM 07/07/2023    1:11 PM  GAD 7 : Generalized Anxiety Score  Nervous, Anxious, on Edge 0 0  Control/stop worrying 0 0  Worry too much - different things 0 0  Trouble relaxing 0 0  Restless 0 0  Easily annoyed or irritable 0 0  Afraid - awful might happen 0 0  Total GAD 7 Score 0 0  Anxiety Difficulty Not difficult at all Not difficult at all    Assessment & Plan:  Encounter for general adult medical examination without abnormal findings -     CBC with Differential/Platelet -     Comprehensive metabolic panel with GFR -     TSH -     Lipid panel  Breast cancer screening by mammogram -     3D  Screening Mammogram, Left and Right; Future    Orders Placed This Encounter  Procedures   MM 3D SCREENING MAMMOGRAM BILATERAL BREAST    Standing Status:   Future    Expiration Date:   07/25/2025    Reason for Exam (SYMPTOM  OR DIAGNOSIS REQUIRED):   screening for breast cancer    Preferred imaging location?:   [facility]    Is the patient pregnant?:   No   CBC with Differential/Platelet   Comprehensive metabolic panel with GFR   TSH   Lipid panel    PATIENT COUNSELING:  - Encouraged a healthy well-balanced diet. Patient may adjust caloric intake to maintain or achieve ideal body weight. May reduce intake of dietary saturated fat and total fat and have adequate dietary potassium and calcium preferably from fresh fruits, vegetables, and low-fat dairy products.   - Advised to avoid cigarette smoking. - Discussed with the patient that most people either abstain from alcohol or drink within safe limits (<=14/week and <=4 drinks/occasion for males, <=7/weeks and <= 3 drinks/occasion for females) and that the risk for alcohol disorders and other health effects rises proportionally with the number of drinks per week and how often a drinker exceeds daily limits. - Discussed cessation/primary prevention of drug use and availability of treatment for abuse.  - Discussed sexually transmitted diseases, avoidance of unintended pregnancy and contraceptive alternatives.  - Stressed the importance of regular exercise - Injury prevention: Discussed safety belts, safety helmets, smoke detector, smoking near bedding or upholstery.  - Dental health: Discussed importance of regular tooth brushing, flossing, and dental visits.   NEXT PREVENTATIVE PHYSICAL DUE IN 1 YEAR.  Return in about 1 year (around 07/25/2025) for Annual Physical Exam with fasting lab work.  Rosina Senters, FNP

## 2024-07-27 ENCOUNTER — Ambulatory Visit: Payer: Self-pay | Admitting: Internal Medicine

## 2024-09-28 ENCOUNTER — Encounter: Payer: Self-pay | Admitting: Internal Medicine

## 2025-07-27 ENCOUNTER — Encounter: Admitting: Internal Medicine
